# Patient Record
Sex: Male | Born: 1980 | Race: White | Hispanic: Yes | Smoking: Current every day smoker
Health system: Southern US, Community
[De-identification: ages and names within clinical notes are randomized; demographics above are authoritative.]

## PROBLEM LIST (undated history)

## (undated) DIAGNOSIS — R51 Headache: Secondary | ICD-10-CM

## (undated) DIAGNOSIS — K219 Gastro-esophageal reflux disease without esophagitis: Secondary | ICD-10-CM

## (undated) DIAGNOSIS — B20 Human immunodeficiency virus [HIV] disease: Secondary | ICD-10-CM

## (undated) DIAGNOSIS — H9191 Unspecified hearing loss, right ear: Secondary | ICD-10-CM

## (undated) DIAGNOSIS — Z9114 Patient's other noncompliance with medication regimen: Secondary | ICD-10-CM

## (undated) DIAGNOSIS — Z91148 Patient's other noncompliance with medication regimen for other reason: Secondary | ICD-10-CM

## (undated) DIAGNOSIS — R519 Headache, unspecified: Secondary | ICD-10-CM

---
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
## 2013-07-02 ENCOUNTER — Encounter (HOSPITAL_COMMUNITY): Payer: Self-pay | Admitting: Emergency Medicine

## 2013-07-02 ENCOUNTER — Emergency Department (HOSPITAL_COMMUNITY)
Admission: EM | Admit: 2013-07-02 | Discharge: 2013-07-02 | Disposition: A | Discharge status: Home / Self Care | Payer: BC Managed Care – PPO | Attending: Emergency Medicine | Admitting: Emergency Medicine

## 2013-07-02 ENCOUNTER — Emergency Department (HOSPITAL_COMMUNITY): Payer: BC Managed Care – PPO

## 2013-07-02 DIAGNOSIS — K610 Anal abscess: Secondary | ICD-10-CM

## 2013-07-02 DIAGNOSIS — K612 Anorectal abscess: Secondary | ICD-10-CM | POA: Insufficient documentation

## 2013-07-02 DIAGNOSIS — Z87891 Personal history of nicotine dependence: Secondary | ICD-10-CM | POA: Insufficient documentation

## 2013-07-02 LAB — BASIC METABOLIC PANEL
BUN: 8 mg/dL (ref 6–23)
Calcium: 10 mg/dL (ref 8.4–10.5)
Chloride: 100 mEq/L (ref 96–112)
Creatinine, Ser: 0.76 mg/dL (ref 0.50–1.35)
GFR calc Af Amer: >90 mL/min (ref >90)

## 2013-07-02 LAB — CBC WITH DIFFERENTIAL/PLATELET
Basophils Absolute: 0 10*3/uL (ref 0.0–0.1)
Basophils Relative: 0 % (ref 0–1)
Eosinophils Absolute: 0.3 10*3/uL (ref 0.0–0.7)
Eosinophils Relative: 3 % (ref 0–5)
HCT: 38.1 % — ABNORMAL LOW (ref 39.0–52.0)
Lymphocytes Relative: 27 % (ref 12–46)
MCH: 33.2 pg (ref 26.0–34.0)
MCHC: 33.6 g/dL (ref 30.0–36.0)
Monocytes Absolute: 0.9 10*3/uL (ref 0.1–1.0)
Monocytes Relative: 9 % (ref 3–12)
Neutro Abs: 5.6 10*3/uL (ref 1.7–7.7)
RDW: 14.2 % (ref 11.5–15.5)
WBC: 9.2 10*3/uL (ref 4.0–10.5)

## 2013-07-02 MED ORDER — IOHEXOL 300 MG/ML  SOLN
100.0000 mL | Freq: Once | INTRAMUSCULAR | Status: AC | PRN
  Administered 2013-07-02: 100 mL via INTRAVENOUS

## 2013-07-02 MED ORDER — AMPICILLIN-SULBACTAM SODIUM 3 (2-1) G IJ SOLR
3.0000 g | Freq: Once | INTRAMUSCULAR | Status: AC
  Administered 2013-07-02: 3 g via INTRAVENOUS
  Filled 2013-07-02: qty 3

## 2013-07-02 MED ORDER — DOCUSATE SODIUM 100 MG PO CAPS
100.0000 mg | ORAL_CAPSULE | Freq: Two times a day (BID) | ORAL | Status: DC
Start: 2013-07-02 — End: 2014-09-06

## 2013-07-02 MED ORDER — MORPHINE SULFATE 4 MG/ML IJ SOLN
4.0000 mg | Freq: Once | INTRAMUSCULAR | Status: AC
  Administered 2013-07-02: 4 mg via INTRAVENOUS
  Filled 2013-07-02: qty 1

## 2013-07-02 MED ORDER — LIDOCAINE-EPINEPHRINE (PF) 1 %-1:200000 IJ SOLN
10.0000 mL | Freq: Once | INTRAMUSCULAR | Status: AC
  Administered 2013-07-02: 10 mL
  Filled 2013-07-02 (×2): qty 10

## 2013-07-02 MED ORDER — METRONIDAZOLE 500 MG PO TABS: 500.0000 mg | ORAL_TABLET | Freq: Two times a day (BID) | ORAL | Status: DC

## 2013-07-02 MED ORDER — OXYCODONE-ACETAMINOPHEN 5-325 MG PO TABS: 2.0000 | ORAL_TABLET | ORAL | Status: DC | PRN

## 2013-07-02 NOTE — ED Notes (Signed)
Pt tolerated draining of abscess well. Bandage applied.

## 2013-07-02 NOTE — ED Notes (Signed)
Pt reports abscess-like area at perineal area x1 week. Pt denies drainage or bleeding from area. Pt reports pain in area.

## 2013-07-02 NOTE — ED Provider Notes (Signed)
CSN: [id number]     Arrival date & time 07/02/13  1103 History   First MD Initiated Contact with Patient 07/02/13 1309     This chart was scribed for Joya Gaskins, MD by Ellin Mayhew, ED Scribe. This patient was seen in room APA12/APA12 and the patient's care was started at 1:26 PM.    Chief Complaint  Patient presents with  . Abscess   Patient is a 32 y.o. male presenting with abscess. The history is provided by the patient. No language interpreter was used.  Abscess Location:  Ano-genital Ano-genital abscess location:  Perineum Abscess quality: painful   Abscess quality: not draining   Duration:  1 week Progression:  Worsening Associated symptoms: no fever, no nausea and no vomiting     HPI Comments: Mouhamed Glassco is a 32 y.o. male who presents to the Emergency Department complaining of abscess near the perirectal region that he noticed one week ago. Patient experienced some pain with this that was at its worse yesterday. Patient denies fever, chills, vomiting or diarrhea. Patient denies any related illness.    PMH - none History reviewed. No pertinent past surgical history. Family History  Problem Relation Age of Onset  . Cancer Other   . Diabetes Other    History  Substance Use Topics  . Smoking status: Former Smoker -- 2.00 packs/day for 20 years    Types: Cigarettes    Quit date: 06/07/2013  . Smokeless tobacco: Current User    Types: Snuff  . Alcohol Use: Yes     Comment: occasional    Review of Systems  Constitutional: Negative for fever and chills.  Gastrointestinal: Negative for nausea, vomiting, abdominal pain and diarrhea.  Genitourinary: Negative for dysuria.  Skin: Positive for wound.  All other systems reviewed and are negative.    Allergies  Review of patient's allergies indicates no known allergies.  Home Medications  No current outpatient prescriptions on file. Triage Vitals: BP 147/91  Pulse 96  Temp(Src) 98 F (36.7 C) (Oral)  Resp  20  Ht 5\' 9"  (1.753 m)  Wt 205 lb (92.987 kg)  BMI 30.26 kg/m2  SpO2 97% Physical Exam  Nursing note and vitals reviewed. CONSTITUTIONAL: Well developed/well nourished HEAD: Normocephalic/atraumatic EYES: EOMI/PERRL ENMT: Mucous membranes moist NECK: supple no meningeal signs SPINE:entire spine nontender CV: S1/S2 noted, no murmurs/rubs/gallops noted LUNGS: Lungs are clear to auscultation bilaterally, no apparent distress ABDOMEN: soft, nontender, no rebound or guarding GU:no cva tenderness, large perirectal abscess, no crepitance or drainage noted, chaperone present NEURO: Pt is awake/alert, moves all extremitiesx4 EXTREMITIES: pulses normal, full ROM SKIN: warm, color normal PSYCH: no abnormalities of mood noted   ED Course  Procedures (including critical care time)  Medications  morphine 4 MG/ML injection 4 mg (not administered)  Ampicillin-Sulbactam (UNASYN) 3 g in sodium chloride 0.9 % 100 mL IVPB (not administered)    DIAGNOSTIC STUDIES: Oxygen Saturation is 97% on room air, normal by my interpretation.    COORDINATION OF CARE: 1:31 PM-CT Scan of pelvis ordered. Treatment plan discussed and patient agrees 3:07 PM Ct scan shows superficial abscess Plan is to drain in the Ed D/w dr Lovell Sheehan, requests send home with flagyll and f/u on Thursday  INCISION AND DRAINAGE Performed by: Joya Gaskins Consent: Verbal consent obtained. Risks and benefits: risks, benefits and alternatives were discussed Type: abscess  Body area: perianal region  Anesthesia: local infiltration  Incision was made with a scalpel.  Local anesthetic: lidocaine 1% with epinephrine  Anesthetic  total: 5 ml  Complexity: complex Blunt dissection to break up loculations  Drainage: purulent  Drainage amount: significant  Pt refused to have further treatment and refused packing  Patient tolerance: Patient tolerated the procedure well with no immediate complications. chaperone present  for exam    Labs Review Labs Reviewed - No data to display Imaging Review No results found.  EKG Interpretation   None       MDM  No diagnosis found. Nursing notes including past medical history and social history reviewed and considered in documentation Labs/vital reviewed and considered    I personally performed the services described in this documentation, which was scribed in my presence. The recorded information has been reviewed and is accurate.      Joya Gaskins, MD 07/02/13 [phone]

## 2013-07-02 NOTE — ED Notes (Signed)
Patient c/o abscess between scrotum and rectum. Patient denies any drainage, unsure of any fevers. Denies any pain with BM but pain with wiping.

## 2014-06-08 ENCOUNTER — Encounter (HOSPITAL_COMMUNITY): Payer: Self-pay | Admitting: Emergency Medicine

## 2014-06-08 ENCOUNTER — Emergency Department (HOSPITAL_COMMUNITY)
Admission: EM | Admit: 2014-06-08 | Discharge: 2014-06-08 | Disposition: A | Discharge status: Home / Self Care | Payer: BC Managed Care – PPO | Attending: Emergency Medicine | Admitting: Emergency Medicine

## 2014-06-08 ENCOUNTER — Emergency Department (HOSPITAL_COMMUNITY): Payer: BC Managed Care – PPO

## 2014-06-08 DIAGNOSIS — R51 Headache: Secondary | ICD-10-CM | POA: Insufficient documentation

## 2014-06-08 DIAGNOSIS — R519 Headache, unspecified: Secondary | ICD-10-CM

## 2014-06-08 DIAGNOSIS — Z87891 Personal history of nicotine dependence: Secondary | ICD-10-CM | POA: Insufficient documentation

## 2014-06-08 LAB — BASIC METABOLIC PANEL
Anion gap: 12 (ref 5–15)
BUN: 13 mg/dL (ref 6–23)
CO2: 26 mEq/L (ref 19–32)
Calcium: 9.5 mg/dL (ref 8.4–10.5)
Chloride: 97 mEq/L (ref 96–112)
Creatinine, Ser: 0.77 mg/dL (ref 0.50–1.35)
GFR calc Af Amer: >90 mL/min (ref >90)
GFR calc non Af Amer: >90 mL/min (ref >90)
Glucose, Bld: 121 mg/dL — ABNORMAL HIGH (ref 70–99)
Potassium: 3.5 mEq/L — ABNORMAL LOW (ref 3.7–5.3)
Sodium: 135 mEq/L — ABNORMAL LOW (ref 137–147)

## 2014-06-08 MED ORDER — DIPHENHYDRAMINE HCL 50 MG/ML IJ SOLN
25.0000 mg | Freq: Once | INTRAMUSCULAR | Status: AC
  Administered 2014-06-08: 25 mg via INTRAVENOUS
  Filled 2014-06-08: qty 1

## 2014-06-08 MED ORDER — KETOROLAC TROMETHAMINE 30 MG/ML IJ SOLN
INTRAMUSCULAR | Status: AC
  Administered 2014-06-08: 30 mg
  Filled 2014-06-08: qty 1

## 2014-06-08 MED ORDER — PROCHLORPERAZINE EDISYLATE 5 MG/ML IJ SOLN
10.0000 mg | Freq: Four times a day (QID) | INTRAMUSCULAR | Status: DC | PRN
  Administered 2014-06-08: 10 mg via INTRAVENOUS
  Filled 2014-06-08: qty 2

## 2014-06-08 MED ORDER — KETOROLAC TROMETHAMINE 15 MG/ML IJ SOLN
15.0000 mg | Freq: Once | INTRAMUSCULAR | Status: AC
  Filled 2014-06-08: qty 1

## 2014-06-08 MED ORDER — SODIUM CHLORIDE 0.9 % IV BOLUS (SEPSIS)
1000.0000 mL | Freq: Once | INTRAVENOUS | Status: AC
  Administered 2014-06-08: 1000 mL via INTRAVENOUS

## 2014-06-08 NOTE — ED Provider Notes (Signed)
CSN: [id number]     Arrival date & time 06/08/14  1934 History   First MD Initiated Contact with Patient 06/08/14 1943     Chief Complaint  Patient presents with  . Headache     (Consider location/radiation/quality/duration/timing/severity/associated sxs/prior Treatment) HPI   33 year old male with headache. Gradual onset 3 days ago. Pain is the right temporal region with occasional sharper pain behind his right ear. Headache is been constant since onset. No appreciable exacerbating relieving factors. No fevers or chills. No nausea or vomiting. No change in visual acuity or other visual complaints. Denies any smoking history. He does have a history of HIV and reports noncompliance with his medications. No unusual rash. No family history of aneurysm that he is aware of. Neck pain or neck stiffness. No pain in his right ear any drainage. No shortness of breath. Has been taking NSAIDs with minimal relief.  History reviewed. No pertinent past medical history. History reviewed. No pertinent past surgical history. Family History  Problem Relation Age of Onset  . Cancer Other   . Diabetes Other    History  Substance Use Topics  . Smoking status: Former Smoker -- 2.00 packs/day for 20 years    Types: Cigarettes    Quit date: 06/07/2013  . Smokeless tobacco: Current User    Types: Snuff  . Alcohol Use: Yes     Comment: occasional    Review of Systems  All systems reviewed and negative, other than as noted in HPI.   Allergies  Review of patient's allergies indicates no known allergies.  Home Medications   Prior to Admission medications   Medication Sig Start Date End Date Taking? Authorizing Provider  AFLURIA PRESERVATIVE FREE 0.5 ML SUSY once.  04/25/14  Yes Historical Provider, MD  phenylephrine (SUDAFED PE) 10 MG TABS tablet Take 10-20 mg by mouth every 4 (four) hours as needed (for congestion).   Yes Historical Provider, MD  docusate sodium (COLACE) 100 MG capsule Take 1  capsule (100 mg total) by mouth every 12 (twelve) hours. Patient not taking: Reported on 06/08/2014 07/02/13   Joya Gaskinsonald W Wickline, MD  metroNIDAZOLE (FLAGYL) 500 MG tablet Take 1 tablet (500 mg total) by mouth 2 (two) times daily. One po bid x 7 days Patient not taking: Reported on 06/08/2014 07/02/13   Joya Gaskinsonald W Wickline, MD  oxyCODONE-acetaminophen (PERCOCET/ROXICET) 5-325 MG per tablet Take 2 tablets by mouth every 4 (four) hours as needed for severe pain. Patient not taking: Reported on 06/08/2014 07/02/13   Joya Gaskinsonald W Wickline, MD   BP 138/90 mmHg  Pulse 87  Temp(Src) 98.2 F (36.8 C) (Oral)  Resp 20  Ht 5\' 9"  (1.753 m)  Wt 215 lb (97.523 kg)  BMI 31.74 kg/m2  SpO2 96% Physical Exam  Constitutional: He is oriented to person, place, and time. He appears well-developed and well-nourished. No distress.  HENT:  Head: Normocephalic and atraumatic.  Right Ear: External ear normal.  Left Ear: External ear normal.  No mastoid tenderness or concerning skin/scalp lesions noted.  Eyes: Conjunctivae and EOM are normal. Pupils are equal, round, and reactive to light. Right eye exhibits no discharge. Left eye exhibits no discharge.  Neck: Neck supple.  No nuchal rigidity. Negative Kernig's and Brudzinski signs.  Cardiovascular: Normal rate, regular rhythm and normal heart sounds.  Exam reveals no gallop and no friction rub.   No murmur heard. Pulmonary/Chest: Effort normal and breath sounds normal. No respiratory distress.  Abdominal: Soft. He exhibits no distension. There is no  tenderness.  Musculoskeletal: He exhibits no edema or tenderness.  Neurological: He is alert and oriented to person, place, and time. No cranial nerve deficit. He exhibits normal muscle tone. Coordination normal.  Speech clear. Content appropriate. Follows commands. Good finger to nose testing bilaterally.  Skin: Skin is warm and dry. He is not diaphoretic.  Psychiatric: He has a normal mood and affect. His behavior is  normal. Thought content normal.  Nursing note and vitals reviewed.   ED Course  Procedures (including critical care time) Labs Review Labs Reviewed  BASIC METABOLIC PANEL - Abnormal; Notable for the following:    Sodium 135 (*)    Potassium 3.5 (*)    Glucose, Bld 121 (*)    All other components within normal limits    Imaging Review No results found.   EKG Interpretation None      MDM   Final diagnoses:  Headache    33 year old male with right-sided headache. No trauma. Afebrile. No nuchal rigidity. Denies any trauma. No acute visual complaints. Eye exam is unremarkable. Patient does not have a significant headache history and is noncompliant with his HIV medications, otherwise no other particularly concerning features. Doubt bleed. No meningismus on exam. Higher consideration for possible infectious etiologies particularly atypical with his history, but clinically doubt. Doubt glaucoma or other ocular etiology. No contacts with similar symptoms to suggest carbon monoxide poisoning. No use of blood thinners or known prothrombotic state. Patient is very anxious about his headache and the fact that he's been noncompliant with his HIV medications. Ultimately agreed to CAT scan his head which I do not think is completely unreasonable. We'll obtain some labs as well. Symptomatic treatment. This workup reveals significant abnormality, anticipate discharge assuming can obtain adequate symptom control.    Raeford RazorStephen Gracelyn Coventry, MD 06/13/14 1136

## 2014-06-08 NOTE — Discharge Instructions (Signed)

## 2014-06-08 NOTE — ED Notes (Signed)
Complaining of headache x 3days on right side,sharp pain behind right ear at time, dizziness, usually did not has headaches at all.

## 2014-08-29 ENCOUNTER — Encounter (HOSPITAL_COMMUNITY): Payer: Self-pay | Admitting: Emergency Medicine

## 2014-08-29 ENCOUNTER — Emergency Department (HOSPITAL_COMMUNITY)
Admission: EM | Admit: 2014-08-29 | Discharge: 2014-08-30 | Disposition: A | Discharge status: Home / Self Care | Payer: BLUE CROSS/BLUE SHIELD | Attending: Emergency Medicine | Admitting: Emergency Medicine

## 2014-08-29 DIAGNOSIS — R51 Headache: Secondary | ICD-10-CM | POA: Diagnosis present

## 2014-08-29 DIAGNOSIS — Z79899 Other long term (current) drug therapy: Secondary | ICD-10-CM | POA: Diagnosis not present

## 2014-08-29 DIAGNOSIS — Z9114 Patient's other noncompliance with medication regimen: Secondary | ICD-10-CM | POA: Diagnosis not present

## 2014-08-29 DIAGNOSIS — G43019 Migraine without aura, intractable, without status migrainosus: Secondary | ICD-10-CM | POA: Insufficient documentation

## 2014-08-29 DIAGNOSIS — Z21 Asymptomatic human immunodeficiency virus [HIV] infection status: Secondary | ICD-10-CM | POA: Diagnosis not present

## 2014-08-29 DIAGNOSIS — G43919 Migraine, unspecified, intractable, without status migrainosus: Secondary | ICD-10-CM

## 2014-08-29 DIAGNOSIS — Z720 Tobacco use: Secondary | ICD-10-CM | POA: Diagnosis not present

## 2014-08-29 HISTORY — DX: Patient's other noncompliance with medication regimen for other reason: Z91.148

## 2014-08-29 HISTORY — DX: Patient's other noncompliance with medication regimen: Z91.14

## 2014-08-29 HISTORY — DX: Human immunodeficiency virus (HIV) disease: B20

## 2014-08-29 HISTORY — DX: Headache: R51

## 2014-08-29 HISTORY — DX: Headache, unspecified: R51.9

## 2014-08-29 NOTE — ED Notes (Signed)
Headache for 13 days, No HI, alert, nausea /vomiting intermittently.

## 2014-08-29 NOTE — ED Provider Notes (Addendum)
CSN: [id number]     Arrival date & time 08/29/14  2016 History  This chart was scribed for Janice Norrie, MD by Edison Simon, ED Scribe. This patient was seen in room APA06/APA06 and the patient's care was started at 12:06 AM.    Chief Complaint  Patient presents with  . Headache   The history is provided by the patient. No language interpreter was used.    HPI Comments: Darrell Ortiz is a 34 y.o. male who presents to the Emergency Department complaining of headache with sudden onset 13 days ago. He states it affects his right eye always and states it is most intense in his lower occipital area. He describes it as pressure and pulsating. He states he is able to sleep, though he sometimes needs Goodys to help. He states pain is worse with coughing, sneezing, and standing up quickly. He denies other difference in pain level due to positional changes. He reports associated nausea and 1 count of vomiting 9 days ago. He notes that his voice sounds odd and suspects he may have fluid in his ears. He reports similar headache 4 months ago and was diagnosed with migraine here. He states he has been using Ibuprofen, Tylenol, and Goodys with some improvement, especially Ibuprofen and even more so with Goodys. He states he used 3-4 doses of Goodys 8 days ago through 4 days ago, then headache almost resolved; it then returned 2 days ago and states he used  6 Goodys that day and 8-9 yesterday. He reports waking with diaphoresis and chills at night since using Goodys. He states he has natural gas heating in his house. He states nobody at his home has similar headaches. He states he smokes 1ppd down from 2 ppd. He states he rarely uses alcohol. 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 He is going to have his vision checked.  He states pain seems to be worse at work, but thinks it may be because he sleeps at home. He has history of HIV dx in 2012 but states it is not followed  and he does not take medications for it. He states he does not get sick easily and denies diarrhea, cough, weight loss. He states he feels healthy. He denies tinnitus, numbness/tingling, fever, photophobia, or phonophobia.  No PCP, per patient  Past Medical History  Diagnosis Date  . HIV disease   . Headache   . Noncompliance with medication regimen    History reviewed. No pertinent past surgical history. Family History  Problem Relation Age of Onset  . Cancer Other   . Diabetes Other    History  Substance Use Topics  . Smoking status: Current Some Day Smoker -- 2.00 packs/day for 20 years    Types: Cigarettes    Last Attempt to Quit: 06/07/2013  . Smokeless tobacco: Current User    Types: Snuff  . Alcohol Use: Yes     Comment: occasional  employed Smokes 1 ppd  Review of Systems  Constitutional: Positive for chills and diaphoresis. Negative for fever.  HENT: Negative for tinnitus.   Eyes: Negative for photophobia.  Gastrointestinal: Positive for nausea and vomiting. Negative for diarrhea.  Neurological: Positive for headaches. Negative for numbness.  All other systems reviewed and are negative.     Allergies  Review of patient's allergies indicates no known allergies.  Home Medications   Prior to Admission medications   Medication Sig Start Date End  Date Taking? Authorizing Provider  AFLURIA PRESERVATIVE FREE 0.5 ML SUSY once.  04/25/14   Historical Provider, MD  docusate sodium (COLACE) 100 MG capsule Take 1 capsule (100 mg total) by mouth every 12 (twelve) hours. Patient not taking: Reported on 06/08/2014 07/02/13   Joya Gaskins, MD  metroNIDAZOLE (FLAGYL) 500 MG tablet Take 1 tablet (500 mg total) by mouth 2 (two) times daily. One po bid x 7 days Patient not taking: Reported on 06/08/2014 07/02/13   Joya Gaskins, MD  oxyCODONE-acetaminophen (PERCOCET/ROXICET) 5-325 MG per tablet Take 2 tablets by mouth every 4 (four) hours as needed for severe  pain. Patient not taking: Reported on 06/08/2014 07/02/13   Joya Gaskins, MD  phenylephrine (SUDAFED PE) 10 MG TABS tablet Take 10-20 mg by mouth every 4 (four) hours as needed (for congestion).    Historical Provider, MD   BP 134/86 mmHg  Pulse 97  Temp(Src) 98.7 F (37.1 C) (Oral)  Resp 18  Ht 5\' 9"  (1.753 m)  Wt 199 lb (90.266 kg)  BMI 29.37 kg/m2  SpO2 97%  Vital signs normal   Physical Exam  Constitutional: He is oriented to person, place, and time. He appears well-developed and well-nourished.  Non-toxic appearance. He does not appear ill. He appears distressed.  Appears uncomfortable  HENT:  Head: Normocephalic and atraumatic.  Right Ear: Hearing, tympanic membrane, external ear and ear canal normal.  Left Ear: Hearing, tympanic membrane, external ear and ear canal normal.  Nose: Nose normal. No mucosal edema or rhinorrhea.  Mouth/Throat: Oropharynx is clear and moist and mucous membranes are normal. No dental abscesses or uvula swelling.  Eyes: Conjunctivae and EOM are normal. Pupils are equal, round, and reactive to light.  Neck: Normal range of motion and full passive range of motion without pain. Neck supple.  Cardiovascular: Normal rate, regular rhythm and normal heart sounds.  Exam reveals no gallop and no friction rub.   No murmur heard. Pulmonary/Chest: Effort normal and breath sounds normal. No respiratory distress. He has no wheezes. He has no rhonchi. He has no rales. He exhibits no tenderness and no crepitus.  Abdominal: Soft. Normal appearance and bowel sounds are normal. He exhibits no distension. There is no tenderness. There is no rebound and no guarding.  Musculoskeletal: Normal range of motion. He exhibits no edema or tenderness.  Moves all extremities well.   Neurological: He is alert and oriented to person, place, and time. He has normal strength. No cranial nerve deficit.  Skin: Skin is warm, dry and intact. No rash noted. No erythema. No pallor.   Psychiatric: He has a normal mood and affect. His speech is normal and behavior is normal. His mood appears not anxious.  Nursing note and vitals reviewed.   ED Course  Procedures (including critical care time)  Medications  sodium chloride 0.9 % bolus 1,000 mL (1,000 mLs Intravenous New Bag/Given 08/30/14 0028)  metoCLOPramide (REGLAN) injection 10 mg (10 mg Intravenous Given 08/30/14 0031)  diphenhydrAMINE (BENADRYL) injection 25 mg (25 mg Intravenous Given 08/30/14 0029)  dexamethasone (DECADRON) injection 10 mg (10 mg Intravenous Given 08/30/14 0032)     DIAGNOSTIC STUDIES: Oxygen Saturation is 99% on room air, normal by my interpretation.    COORDINATION OF CARE: 12:20 AM Discussed treatment plan with patient at beside, including ASA level check and migraine cocktail. The patient agrees with the plan and has no further questions at this time. Patient had CT scan of his head done with his last  ED visit for headache in November. The CT scan was reviewed by me. Patient also had a normal CBC and be met done in November with his headache visit.  Recheck at discharge. Patient states his headache is gone.   Labs Review Results for orders placed or performed during the hospital encounter of 93/71/69  Salicylate level  Result Value Ref Range   Salicylate Lvl 67.8 2.8 - 20.0 mg/dL   Laboratory interpretation all normal with nontoxic aspirin level    Imaging Review No results found.   EKG Interpretation None      MDM   Final diagnoses:  Intractable migraine, unspecified migraine type   Plan discharge  Rolland Porter, MD, FACEP   I personally performed the services described in this documentation, which was scribed in my presence. The recorded information has been reviewed and considered.  Rolland Porter, MD, FACEP   Janice Norrie, MD 08/30/14 Denyse Dago  Janice Norrie, MD 08/30/14 [phone]

## 2014-08-30 LAB — SALICYLATE LEVEL: SALICYLATE LVL: 12.3 mg/dL (ref 2.8–20.0)

## 2014-08-30 MED ORDER — METOCLOPRAMIDE HCL 5 MG/ML IJ SOLN
10.0000 mg | Freq: Once | INTRAMUSCULAR | Status: AC
  Administered 2014-08-30: 10 mg via INTRAVENOUS
  Filled 2014-08-30: qty 2

## 2014-08-30 MED ORDER — SODIUM CHLORIDE 0.9 % IV BOLUS (SEPSIS)
1000.0000 mL | Freq: Once | INTRAVENOUS | Status: AC
  Administered 2014-08-30: 1000 mL via INTRAVENOUS

## 2014-08-30 MED ORDER — DIPHENHYDRAMINE HCL 50 MG/ML IJ SOLN
25.0000 mg | Freq: Once | INTRAMUSCULAR | Status: AC
  Administered 2014-08-30: 25 mg via INTRAVENOUS
  Filled 2014-08-30: qty 1

## 2014-08-30 MED ORDER — DEXAMETHASONE SODIUM PHOSPHATE 4 MG/ML IJ SOLN
10.0000 mg | Freq: Once | INTRAMUSCULAR | Status: AC
  Administered 2014-08-30: 10 mg via INTRAVENOUS
  Filled 2014-08-30: qty 3

## 2014-08-30 NOTE — Discharge Instructions (Signed)
Go home and rest. Drink plenty of fluids. Recheck if you feel worse again.    Recurrent Migraine Headache A migraine headache is an intense, throbbing pain on one or both sides of your head. Recurrent migraines keep coming back. A migraine can last for 30 minutes to several hours. CAUSES  The exact cause of a migraine headache is not always known. However, a migraine may be caused when nerves in the brain become irritated and release chemicals that cause inflammation. This causes pain. Certain things may also trigger migraines, such as:   Alcohol.  Smoking.  Stress.  Menstruation.  Aged cheeses.  Foods or drinks that contain nitrates, glutamate, aspartame, or tyramine.  Lack of sleep.  Chocolate.  Caffeine.  Hunger.  Physical exertion.  Fatigue.  Medicines used to treat chest pain (nitroglycerine), birth control pills, estrogen, and some blood pressure medicines. SYMPTOMS   Pain on one or both sides of your head.  Pulsating or throbbing pain.  Severe pain that prevents daily activities.  Pain that is aggravated by any physical activity.  Nausea, vomiting, or both.  Dizziness.  Pain with exposure to bright lights, loud noises, or activity.  General sensitivity to bright lights, loud noises, or smells. Before you get a migraine, you may get warning signs that a migraine is coming (aura). An aura may include:  Seeing flashing lights.  Seeing bright spots, halos, or zigzag lines.  Having tunnel vision or blurred vision.  Having feelings of numbness or tingling.  Having trouble talking.  Having muscle weakness. DIAGNOSIS  A recurrent migraine headache is often diagnosed based on:  Symptoms.  Physical examination.  A CT scan or MRI of your head. These imaging tests cannot diagnose migraines but can help rule out other causes of headaches.  TREATMENT  Medicines may be given for pain and nausea. Medicines can also be given to help prevent recurrent  migraines. HOME CARE INSTRUCTIONS  Only take over-the-counter or prescription medicines for pain or discomfort as directed by your health care provider. The use of long-term narcotics is not recommended.  Lie down in a dark, quiet room when you have a migraine.  Keep a journal to find out what may trigger your migraine headaches. For example, write down:  What you eat and drink.  How much sleep you get.  Any change to your diet or medicines.  Limit alcohol consumption.  Quit smoking if you smoke.  Get 7-9 hours of sleep, or as recommended by your health care provider.  Limit stress.  Keep lights dim if bright lights bother you and make your migraines worse. SEEK MEDICAL CARE IF:   You do not get relief from the medicines given to you.  You have a recurrence of pain.  You have a fever. SEEK IMMEDIATE MEDICAL CARE IF:  Your migraine becomes severe.  You have a stiff neck.  You have loss of vision.  You have muscular weakness or loss of muscle control.  You start losing your balance or have trouble walking.  You feel faint or pass out.  You have severe symptoms that are different from your first symptoms. MAKE SURE YOU:   Understand these instructions.  Will watch your condition.  Will get help right away if you are not doing well or get worse. Document Released: 04/06/2001 Document Revised: 11/26/2013 Document Reviewed: 03/19/2013 [facility] [place] [facility] Patient Information 2015 [place], [place]. This information is not intended to replace advice given to you by your health care provider. Make sure you discuss any  questions you have with your health care provider. ° °

## 2014-08-30 NOTE — ED Notes (Signed)
Pt resting calmly w/ eyes closed. Rise & fall of the chest noted. Family at bedside. Bed in low position, side rails up x2. NAD noted at this time.  

## 2014-09-06 ENCOUNTER — Inpatient Hospital Stay (HOSPITAL_COMMUNITY)
Admission: EM | Admit: 2014-09-06 | Discharge: 2014-09-11 | DRG: 070 | Disposition: A | Discharge status: Home / Self Care | Payer: BLUE CROSS/BLUE SHIELD | Attending: Internal Medicine | Admitting: Internal Medicine

## 2014-09-06 ENCOUNTER — Emergency Department (HOSPITAL_COMMUNITY): Payer: BLUE CROSS/BLUE SHIELD

## 2014-09-06 ENCOUNTER — Inpatient Hospital Stay (HOSPITAL_COMMUNITY): Payer: BLUE CROSS/BLUE SHIELD

## 2014-09-06 ENCOUNTER — Encounter (HOSPITAL_COMMUNITY): Payer: Self-pay

## 2014-09-06 DIAGNOSIS — K5900 Constipation, unspecified: Secondary | ICD-10-CM | POA: Diagnosis present

## 2014-09-06 DIAGNOSIS — K5909 Other constipation: Secondary | ICD-10-CM | POA: Diagnosis not present

## 2014-09-06 DIAGNOSIS — R51 Headache: Secondary | ICD-10-CM

## 2014-09-06 DIAGNOSIS — Z791 Long term (current) use of non-steroidal anti-inflammatories (NSAID): Secondary | ICD-10-CM | POA: Diagnosis not present

## 2014-09-06 DIAGNOSIS — A523 Neurosyphilis, unspecified: Secondary | ICD-10-CM | POA: Diagnosis present

## 2014-09-06 DIAGNOSIS — R519 Headache, unspecified: Secondary | ICD-10-CM | POA: Diagnosis present

## 2014-09-06 DIAGNOSIS — Z79899 Other long term (current) drug therapy: Secondary | ICD-10-CM | POA: Diagnosis not present

## 2014-09-06 DIAGNOSIS — G939 Disorder of brain, unspecified: Secondary | ICD-10-CM | POA: Diagnosis not present

## 2014-09-06 DIAGNOSIS — H9191 Unspecified hearing loss, right ear: Secondary | ICD-10-CM | POA: Diagnosis present

## 2014-09-06 DIAGNOSIS — Z9114 Patient's other noncompliance with medication regimen: Secondary | ICD-10-CM | POA: Diagnosis present

## 2014-09-06 DIAGNOSIS — F17210 Nicotine dependence, cigarettes, uncomplicated: Secondary | ICD-10-CM | POA: Diagnosis present

## 2014-09-06 DIAGNOSIS — B20 Human immunodeficiency virus [HIV] disease: Secondary | ICD-10-CM | POA: Diagnosis present

## 2014-09-06 DIAGNOSIS — K219 Gastro-esophageal reflux disease without esophagitis: Secondary | ICD-10-CM | POA: Diagnosis present

## 2014-09-06 DIAGNOSIS — G9389 Other specified disorders of brain: Principal | ICD-10-CM | POA: Diagnosis present

## 2014-09-06 DIAGNOSIS — Z9119 Patient's noncompliance with other medical treatment and regimen: Secondary | ICD-10-CM | POA: Diagnosis present

## 2014-09-06 DIAGNOSIS — A872 Lymphocytic choriomeningitis: Secondary | ICD-10-CM | POA: Diagnosis present

## 2014-09-06 DIAGNOSIS — Z91199 Patient's noncompliance with other medical treatment and regimen due to unspecified reason: Secondary | ICD-10-CM | POA: Diagnosis present

## 2014-09-06 HISTORY — DX: Unspecified hearing loss, right ear: H91.91

## 2014-09-06 HISTORY — DX: Gastro-esophageal reflux disease without esophagitis: K21.9

## 2014-09-06 LAB — CBC WITH DIFFERENTIAL/PLATELET
BASOS ABS: 0 10*3/uL (ref 0.0–0.1)
BASOS PCT: 0 % (ref 0–1)
EOS ABS: 0.1 10*3/uL (ref 0.0–0.7)
Eosinophils Relative: 2 % (ref 0–5)
HCT: 35.7 % — ABNORMAL LOW (ref 39.0–52.0)
Hemoglobin: 12 g/dL — ABNORMAL LOW (ref 13.0–17.0)
Lymphocytes Relative: 31 % (ref 12–46)
Lymphs Abs: 1 10*3/uL (ref 0.7–4.0)
MCH: 33.1 pg (ref 26.0–34.0)
MCHC: 33.6 g/dL (ref 30.0–36.0)
MCV: 98.6 fL (ref 78.0–100.0)
Monocytes Absolute: 0.3 10*3/uL (ref 0.1–1.0)
Monocytes Relative: 11 % (ref 3–12)
NEUTROS ABS: 1.8 10*3/uL (ref 1.7–7.7)
Neutrophils Relative %: 57 % (ref 43–77)
PLATELETS: 116 10*3/uL — AB (ref 150–400)
RBC: 3.62 MIL/uL — ABNORMAL LOW (ref 4.22–5.81)
RDW: 12.1 % (ref 11.5–15.5)
WBC: 3.2 10*3/uL — ABNORMAL LOW (ref 4.0–10.5)

## 2014-09-06 LAB — COMPREHENSIVE METABOLIC PANEL
ALT: 39 U/L (ref 0–53)
AST: 20 U/L (ref 0–37)
Albumin: 3.2 g/dL — ABNORMAL LOW (ref 3.5–5.2)
Alkaline Phosphatase: 57 U/L (ref 39–117)
BILIRUBIN TOTAL: 0.4 mg/dL (ref 0.3–1.2)
BUN: 9 mg/dL (ref 6–23)
CO2: 25 mmol/L (ref 19–32)
CREATININE: 0.64 mg/dL (ref 0.50–1.35)
Calcium: 8.2 mg/dL — ABNORMAL LOW (ref 8.4–10.5)
Chloride: 109 mmol/L (ref 96–112)
Glucose, Bld: 98 mg/dL (ref 70–99)
Potassium: 3.9 mmol/L (ref 3.5–5.1)
Sodium: 135 mmol/L (ref 135–145)
Total Protein: 6.5 g/dL (ref 6.0–8.3)

## 2014-09-06 LAB — CBC
HCT: 41.1 % (ref 39.0–52.0)
Hemoglobin: 14.1 g/dL (ref 13.0–17.0)
MCH: 33.6 pg (ref 26.0–34.0)
MCHC: 34.3 g/dL (ref 30.0–36.0)
MCV: 97.9 fL (ref 78.0–100.0)
Platelets: 146 10*3/uL — ABNORMAL LOW (ref 150–400)
RBC: 4.2 MIL/uL — AB (ref 4.22–5.81)
RDW: 12.2 % (ref 11.5–15.5)
WBC: 2.7 10*3/uL — ABNORMAL LOW (ref 4.0–10.5)

## 2014-09-06 LAB — T-HELPER CELLS (CD4) COUNT (NOT AT ARMC)
CD4 % Helper T Cell: 16 % — ABNORMAL LOW (ref 33–55)
CD4 T Cell Abs: 170 /uL — ABNORMAL LOW (ref 400–2700)

## 2014-09-06 MED ORDER — METOCLOPRAMIDE HCL 5 MG/ML IJ SOLN
10.0000 mg | Freq: Once | INTRAMUSCULAR | Status: AC
  Administered 2014-09-06: 10 mg via INTRAVENOUS
  Filled 2014-09-06: qty 2

## 2014-09-06 MED ORDER — HYDROMORPHONE HCL 1 MG/ML IJ SOLN
2.0000 mg | INTRAMUSCULAR | Status: DC | PRN
  Administered 2014-09-07: 1 mg via INTRAVENOUS
  Filled 2014-09-06: qty 2

## 2014-09-06 MED ORDER — LIDOCAINE HCL (PF) 1 % IJ SOLN
15.0000 mL | Freq: Once | INTRAMUSCULAR | Status: AC
  Administered 2014-09-06: 15 mL
  Filled 2014-09-06 (×2): qty 15

## 2014-09-06 MED ORDER — MORPHINE SULFATE 4 MG/ML IJ SOLN
4.0000 mg | Freq: Once | INTRAMUSCULAR | Status: AC | PRN
  Administered 2014-09-06: 4 mg via INTRAVENOUS
  Filled 2014-09-06: qty 1

## 2014-09-06 MED ORDER — KETOROLAC TROMETHAMINE 30 MG/ML IJ SOLN
30.0000 mg | Freq: Once | INTRAMUSCULAR | Status: AC
  Administered 2014-09-06: 30 mg via INTRAVENOUS
  Filled 2014-09-06: qty 1

## 2014-09-06 MED ORDER — DEXAMETHASONE SODIUM PHOSPHATE 10 MG/ML IJ SOLN
10.0000 mg | Freq: Once | INTRAMUSCULAR | Status: AC
  Administered 2014-09-06: 10 mg via INTRAVENOUS
  Filled 2014-09-06: qty 1

## 2014-09-06 MED ORDER — SODIUM CHLORIDE 0.9 % IV SOLN
INTRAVENOUS | Status: DC
  Administered 2014-09-06 – 2014-09-08 (×4): via INTRAVENOUS

## 2014-09-06 MED ORDER — LIDOCAINE HCL (PF) 1 % IJ SOLN
15.0000 mL | Freq: Once | INTRAMUSCULAR | Status: AC
  Administered 2014-09-06: 5 mL
  Filled 2014-09-06: qty 15

## 2014-09-06 MED ORDER — SODIUM CHLORIDE 0.9 % IV BOLUS (SEPSIS)
1000.0000 mL | Freq: Once | INTRAVENOUS | Status: AC
  Administered 2014-09-06: 1000 mL via INTRAVENOUS

## 2014-09-06 MED ORDER — DIPHENHYDRAMINE HCL 50 MG/ML IJ SOLN
25.0000 mg | Freq: Once | INTRAMUSCULAR | Status: AC
  Administered 2014-09-06: 25 mg via INTRAVENOUS
  Filled 2014-09-06: qty 1

## 2014-09-06 MED ORDER — NICOTINE 21 MG/24HR TD PT24
21.0000 mg | MEDICATED_PATCH | Freq: Once | TRANSDERMAL | Status: AC
  Administered 2014-09-06: 21 mg via TRANSDERMAL
  Filled 2014-09-06: qty 1

## 2014-09-06 MED ORDER — LORAZEPAM 2 MG/ML IJ SOLN
1.0000 mg | Freq: Once | INTRAMUSCULAR | Status: AC | PRN
  Administered 2014-09-06: 1 mg via INTRAVENOUS
  Filled 2014-09-06: qty 1

## 2014-09-06 MED ORDER — LIDOCAINE HCL 1 % IJ SOLN: 15.0000 mL | Freq: Once | INTRAMUSCULAR | Status: DC

## 2014-09-06 MED ORDER — ENOXAPARIN SODIUM 40 MG/0.4ML ~~LOC~~ SOLN
40.0000 mg | SUBCUTANEOUS | Status: DC
  Administered 2014-09-06: 40 mg via SUBCUTANEOUS
  Filled 2014-09-06: qty 0.4

## 2014-09-06 MED ORDER — ONDANSETRON HCL 4 MG PO TABS: 4.0000 mg | ORAL_TABLET | Freq: Four times a day (QID) | ORAL | Status: DC | PRN

## 2014-09-06 MED ORDER — GADOBENATE DIMEGLUMINE 529 MG/ML IV SOLN
20.0000 mL | Freq: Once | INTRAVENOUS | Status: AC | PRN
  Administered 2014-09-06: 19 mL via INTRAVENOUS

## 2014-09-06 MED ORDER — ONDANSETRON HCL 4 MG/2ML IJ SOLN
4.0000 mg | Freq: Four times a day (QID) | INTRAMUSCULAR | Status: DC | PRN
  Administered 2014-09-07 – 2014-09-08 (×3): 4 mg via INTRAVENOUS
  Filled 2014-09-06 (×3): qty 2

## 2014-09-06 NOTE — Procedures (Signed)
Indication: evaluate CNS mass  Permit: infromed consent obtained and present in the chart and time out performed  Description: pt laying in fetal position prepped and draped in usual sterile manner. 1% lidocaine used to infiltrate subcutaneous and deep tissues with copious amounts. L4-L5 and L3-L4 areas identified. A total of ELEVEN attempts inserting and withdrawing the needle made every time encountering bone by feel of needle.  Procedure stopped after ELEVENTH attempt and IR called for procedure  EBL: mimimal  Complications: none

## 2014-09-06 NOTE — ED Notes (Signed)
Pt seen here last Thursday for head and neck pain, states after meds given here he had improvement, but in the past few days the pain has worsened again

## 2014-09-06 NOTE — Progress Notes (Signed)
Patient admitted after midnight. Please see H&P.  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  It is difficult determine if this is intra-axial or extra-axial however it may have an element of both. Differential diagnosis includes neoplasm such as lymphoma and Kaposi sarcoma. Metastatic disease and glioma considered less likely. Infection also considered. This is not a typical appearance for toxoplasmosis. Other fungal and tuberculosis infections are considerations. Continue Dilaudid 2 mg IV every 4 hours when necessary - may need LP  HIV Patient has not been taking the antiretroviral therapy for past 4 years. CD4 count ordered. Further recommendations as per infectious disease  Darrell CanaryJessica Govanni Plemons DO

## 2014-09-06 NOTE — Consult Note (Signed)
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 Eliseo Squires   HPI: Darrell Ortiz is an 34 y.o. 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 Had taken maybe 4 bottles of STRIBILD since dx in 2012. Multiple reasons given why not started including worried employer would find out. He has had worsening headaches over the past 3 weeks then with transient numbness in right hand. He was seen at [facility] where CT abnormal and MRI shows:  10 x 12 mm peripheral enhancing lesion in the right parietal lobe. It is difficult determine if this is intra-axial or extra-axial however it may have an element of both     Past Medical History  Diagnosis Date  . HIV disease   . Headache   . Noncompliance with medication regimen     History reviewed. No pertinent past surgical history.ergies:   No Known Allergies   Medications: I have reviewed patients current medications as documented in Epic Anti-infectives    None      Social History:  reports that he has been smoking Cigarettes.  He has a 40 pack-year smoking history. His smokeless tobacco use includes Snuff. He reports that he drinks alcohol. He reports that he uses illicit drugs (Marijuana).  Family History  Problem Relation Age of Onset  . Cancer Other   . Diabetes Other     As in HPI and primary teams notes otherwise 12 point review of systems is negative  Blood pressure 139/93, pulse 74, temperature 98.4 F (36.9 C), temperature source Oral, resp. rate 18, height _0  (1.753 m), weight 200 lb (90.719 kg), SpO2 96 %. General: Alert and awake, oriented x3, not in any acute distress. HEENT: anicteric sclera, pupils reactive to light and accommodation, EOMI, oropharynx clear and without exudate CVS regular rate, normal r,  no murmur rubs or  gallops Chest: clear to auscultation bilaterally, no wheezing, rales or rhonchi Abdomen: soft nontender, nondistended, normal bowel sounds, Extremities: no  clubbing or edema noted bilaterally Skin: no rashes Neuro: nonfocal, strength and sensation intact   Results for orders placed or performed during the hospital encounter of 09/06/14 (from the past 48 hour(s))  CBC with Differential/Platelet     Status: Abnormal   Collection Time: 09/06/14  5:00 AM  Result Value Ref Range   WBC 3.2 (L) 4.0 - 10.5 K/uL   RBC 3.62 (L) 4.22 - 5.81 MIL/uL   Hemoglobin 12.0 (L) 13.0 - 17.0 g/dL   HCT 35.7 (L) 39.0 - 52.0 %   MCV 98.6 78.0 - 100.0 fL   MCH 33.1 26.0 - 34.0 pg   MCHC 33.6 30.0 - 36.0 g/dL   RDW 12.1 11.5 - 15.5 %   Platelets 116 (L) 150 - 400 K/uL    Comment: SPECIMEN CHECKED FOR CLOTS PLATELET COUNT CONFIRMED BY SMEAR    Neutrophils Relative % 57 43 - 77 %   Neutro Abs 1.8 1.7 - 7.7 K/uL   Lymphocytes Relative 31 12 - 46 %   Lymphs Abs 1.0 0.7 - 4.0 K/uL   Monocytes Relative 11 3 - 12 %   Monocytes Absolute 0.3 0.1 - 1.0 K/uL   Eosinophils Relative 2 0 - 5 %   Eosinophils Absolute 0.1 0.0 - 0.7 K/uL   Basophils Relative 0 0 -  1 %   Basophils Absolute 0.0 0.0 - 0.1 K/uL  Comprehensive metabolic panel     Status: Abnormal   Collection Time: 09/06/14  5:00 AM  Result Value Ref Range   Sodium 135 135 - 145 mmol/L   Potassium 3.9 3.5 - 5.1 mmol/L   Chloride 109 96 - 112 mmol/L   CO2 25 19 - 32 mmol/L   Glucose, Bld 98 70 - 99 mg/dL   BUN 9 6 - 23 mg/dL   Creatinine, Ser 0.64 0.50 - 1.35 mg/dL   Calcium 8.2 (L) 8.4 - 10.5 mg/dL   Total Protein 6.5 6.0 - 8.3 g/dL   Albumin 3.2 (L) 3.5 - 5.2 g/dL   AST 20 0 - 37 U/L   ALT 39 0 - 53 U/L   Alkaline Phosphatase 57 39 - 117 U/L   Total Bilirubin 0.4 0.3 - 1.2 mg/dL   GFR calc non Af Amer >90 >90 mL/min   GFR calc Af Amer >90 >90 mL/min    Comment: (NOTE) The eGFR has been calculated using the CKD EPI equation. This calculation  has not been validated in all clinical situations. eGFR's persistently <90 mL/min signify possible Chronic Kidney Disease.    Anion gap NOT CALCULATED 5 - 15  CBC     Status: Abnormal   Collection Time: 09/06/14  8:50 AM  Result Value Ref Range   WBC 2.7 (L) 4.0 - 10.5 K/uL   RBC 4.20 (L) 4.22 - 5.81 MIL/uL   Hemoglobin 14.1 13.0 - 17.0 g/dL   HCT 41.1 39.0 - 52.0 %   MCV 97.9 78.0 - 100.0 fL   MCH 33.6 26.0 - 34.0 pg   MCHC 34.3 30.0 - 36.0 g/dL   RDW 12.2 11.5 - 15.5 %   Platelets 146 (L) 150 - 400 K/uL   _0 (sdes,specrequest,cult,reptstatus)   )No results found for this or any previous visit (from the past 720 hour(s)).   Impression/Recommendation  Active Problems:   Headache   HIV disease   Darrell Ortiz is a 34 y.o. male with  HIV likely AIDS,  peripheral enhancing lesion in the right parietal lobe.  #1 Right parietal lobe lesion: Differential includes Primary CNS lymphoma, Toxoplasmosis (though not typical per radiology), cryptococcoma, brain abscess, tuberculoma  I will attempt LP today  Will plan on checking :  Opening pressure   And sending CSF for   Cell count with differential  CSF protein and glucose  CSF cryptococccal ag  CSF Toxoplasma PCR  CSF EBV PCR  CSF JC virus PCR  CSF MTB PCR  CSF for cytology  CSF culture  Will try to have lab centrifuge dedicated 8-21m of CSF and send sediment for fungal culture  ""  And for AFB culture  Unless can arrive at diagnosis via labs and or empiric therapy and not sure what empiric rx to try at this point, possibly toxo therapy if labs unrevealign he will assuredly need Neurosurgery help for Brain biopsy  #2 HIV/AIDS: will check HIV ultraquant, CD4, need to figure out  What is happening in CNS first and likely will need to delay therapy to avoid IRIS  I spent greater than 60 minutes with the patient including greater than 50% of time in face to face counsel of the patient and in  coordination of their care.    09/06/2014, 11:56 AM   Thank you so much for this interesting consult  [place](pager) [phone](  office) 09/06/2014, 11:56 AM  Rhina Brackett Dam 09/06/2014, 11:56 AM

## 2014-09-06 NOTE — H&P (Signed)
PCP:   No PCP Per Patient   Chief Complaint:  Headache  HPI: 34 year old male who   has a past medical history of HIV disease; Headache; and Noncompliance with medication regimen. Patient today came to the ED with chief complaint of headache which has been going on for past 3 weeks. Patient was seen in the ED a week ago and was given migraine cocktail with good results. Patient says that he felt better for couple of days and then again had the same headache. He says that headache starts behind the right eye and also has a transient neck stiffness especially in the morning. He denies any fever, but felt sweaty after he takes aspirin or Goody powders. He denies any blurred vision, no nausea vomiting or diarrhea. No chest pain In the ED CT scan of the head was done which showed focal white matter hypoattenuation at the right parietal lobe with mild loss of gray-white differentiation. Could be reflective of progressive multifocal leukodystrophy or underlying mass. MRI brain with and without contrast recommended.  Allergies:  No Known Allergies    Past Medical History  Diagnosis Date  . HIV disease   . Headache   . Noncompliance with medication regimen     History reviewed. No pertinent past surgical history.  Prior to Admission medications   Medication Sig Start Date End Date Taking? Authorizing Provider  ibuprofen (ADVIL,MOTRIN) 600 MG tablet Take 600 mg by mouth every 6 (six) hours as needed.   Yes Historical Provider, MD  AFLURIA PRESERVATIVE FREE 0.5 ML SUSY once.  04/25/14   Historical Provider, MD  docusate sodium (COLACE) 100 MG capsule Take 1 capsule (100 mg total) by mouth every 12 (twelve) hours. Patient not taking: Reported on 06/08/2014 07/02/13   Joya Gaskins, MD  metroNIDAZOLE (FLAGYL) 500 MG tablet Take 1 tablet (500 mg total) by mouth 2 (two) times daily. One po bid x 7 days Patient not taking: Reported on 06/08/2014 07/02/13   Joya Gaskins, MD    oxyCODONE-acetaminophen (PERCOCET/ROXICET) 5-325 MG per tablet Take 2 tablets by mouth every 4 (four) hours as needed for severe pain. Patient not taking: Reported on 06/08/2014 07/02/13   Joya Gaskins, MD  phenylephrine (SUDAFED PE) 10 MG TABS tablet Take 10-20 mg by mouth every 4 (four) hours as needed (for congestion).    Historical Provider, MD    Social History:  reports that he has been smoking Cigarettes.  He has a 40 pack-year smoking history. His smokeless tobacco use includes Snuff. He reports that he drinks alcohol. He reports that he uses illicit drugs (Marijuana).  Family History  Problem Relation Age of Onset  . Cancer Other   . Diabetes Other      All the positives are listed in BOLD  Review of Systems:  HEENT: Headache, blurred vision, runny nose, sore throat Neck: Hypothyroidism, hyperthyroidism,,lymphadenopathy Chest : Shortness of breath, history of COPD, Asthma Heart : Chest pain, history of coronary arterey disease GI:  Nausea, vomiting, diarrhea, constipation, GERD GU: Dysuria, urgency, frequency of urination, hematuria Neuro: Stroke, seizures, syncope Psych: Depression, anxiety, hallucinations   Physical Exam: Blood pressure 151/92, pulse 73, temperature 98.4 F (36.9 C), temperature source Oral, resp. rate 18, height  (1.753 m), weight 88.451 kg (195 lb), SpO2 96 %. Constitutional:   Patient is a well-developed and well-nourished male* in no acute distress and cooperative with exam. Head: Normocephalic and atraumatic Mouth: Mucus membranes moist Eyes: PERRL, EOMI, conjunctivae normal Neck: Supple,  No Thyromegaly Cardiovascular: RRR, S1 normal, S2 normal Pulmonary/Chest: CTAB, no wheezes, rales, or rhonchi Abdominal: Soft. Non-tender, non-distended, bowel sounds are normal, no masses, organomegaly, or guarding present.  Neurological: A&O x3, Strength is normal and symmetric bilaterally, cranial nerve II-XII are grossly intact, no focal motor  deficit, sensory intact to light touch bilaterally.  Extremities : No Cyanosis, Clubbing or Edema  Labs on Admission:  Basic Metabolic Panel:  Recent Labs Lab 09/06/14 0500  NA 135  K 3.9  CL 109  CO2 25  GLUCOSE 98  BUN 9  CREATININE 0.64  CALCIUM 8.2*   Liver Function Tests:  Recent Labs Lab 09/06/14 0500  AST 20  ALT 39  ALKPHOS 57  BILITOT 0.4  PROT 6.5  ALBUMIN 3.2*   No results for input(s): LIPASE, AMYLASE in the last 168 hours. No results for input(s): AMMONIA in the last 168 hours. CBC: No results for input(s): WBC, NEUTROABS, HGB, HCT, MCV, PLT in the last 168 hours. Cardiac Enzymes: No results for input(s): CKTOTAL, CKMB, CKMBINDEX, TROPONINI in the last 168 hours.  BNP (last 3 results) No results for input(s): BNP in the last 8760 hours.  ProBNP (last 3 results) No results for input(s): PROBNP in the last 8760 hours.  CBG: No results for input(s): GLUCAP in the last 168 hours.  Radiological Exams on Admission: Ct Head Wo Contrast  09/06/2014   CLINICAL DATA:  Subacute onset of headache for 2 weeks. Current history of HIV. Initial encounter.  EXAM: CT HEAD WITHOUT CONTRAST  TECHNIQUE: Contiguous axial images were obtained from the base of the skull through the vertex without intravenous contrast.  COMPARISON:  CT of the head performed 06/08/2014  FINDINGS: There is no evidence of intra- or extra-axial hemorrhage on CT.  Focal decreased white matter attenuation is noted at the right parietal lobe, with mild loss of gray-white differentiation. Given the history of HIV, this could reflect progressive multifocal leukodystrophy, or possibly an underlying mass. A small abscess could conceivably have a similar appearance, if the patient has corresponding symptoms.  The posterior fossa, including the cerebellum, brainstem and fourth ventricle, is within normal limits. The third and lateral ventricles, and basal ganglia are unremarkable in appearance. No midline  shift is seen.  There is no evidence of fracture; visualized osseous structures are unremarkable in appearance. The visualized portions of the orbits are within normal limits. The paranasal sinuses and mastoid air cells are well-aerated. No significant soft tissue abnormalities are seen.  IMPRESSION: Focal white matter hypoattenuation at the right parietal lobe, with mild loss of gray-white differentiation. Given the history of HIV, this could reflect progressive multifocal leukodystrophy, or possibly an underlying mass. A small abscess could conceivably have a similar appearance, if the patient has corresponding symptoms.  MRI of the brain with and without contrast is recommended for further evaluation; would also correlate with the patient's CD4 count.  These results were called by telephone at the time of interpretation on 09/06/2014 at 4:30 am to Dr. Geoffery Lyons, who verbally acknowledged these results.   Electronically Signed   By: Roanna Raider M.D.   On: 09/06/2014 04:30       Assessment/Plan Active Problems:   Headache   HIV    Headache CT head shows possibility of mass versus abscess versus multifocal leukodystrophy. Infectious disease was consulted by the ED physician, and Dr. Algis Liming [facility]. CD4 count as well as MRI brain with and without contrast has  been ordered. Continue Dilaudid 2 mg IV every 4 hours when necessary  HIV Patient has not been taking the antiretroviral therapy for past 4 years. CD4 count ordered. Further recommendations as per infectious disease  DVT prophylaxis Lovenox   Code status: Full code  Family discussion: No family at bedside   Time Spent on Admission: 45 minutes  Jsiah Menta S [facility] Pager: [phone] 09/06/2014, 5:44 AM  If 7PM-7AM, please contact night-coverage  [email or web address]  Password [email or web address]

## 2014-09-06 NOTE — ED Provider Notes (Signed)
CSN: [id number]     Arrival date & time 09/06/14  [id number] History   None    Chief Complaint  Patient presents with  . Headache     (Consider location/radiation/quality/duration/timing/severity/associated sxs/prior Treatment) HPI Comments: Patient is a 34 year old male with history of HIV disease. He presents with complaints of headache that has been ongoing for the past several weeks. He was seen here a little over a week ago and was given a migraine cocktail with good results. He felt better for a couple of days, then his headache returned. He presents here again for evaluation of this. He denies any fevers or chills. He denies any aura. He does report some discomfort in his neck.  Patient is a 34 y.o. male presenting with headaches. The history is provided by the patient.  Headache Pain location:  Frontal and occipital Quality:  Dull Onset quality:  Gradual Duration:  3 weeks Timing:  Constant Progression:  Unchanged Chronicity:  New Similar to prior headaches: no   Context: activity and bright light   Relieved by:  Nothing Worsened by:  Nothing Ineffective treatments:  None tried   Past Medical History  Diagnosis Date  . HIV disease   . Headache   . Noncompliance with medication regimen    History reviewed. No pertinent past surgical history. Family History  Problem Relation Age of Onset  . Cancer Other   . Diabetes Other    History  Substance Use Topics  . Smoking status: Current Some Day Smoker -- 2.00 packs/day for 20 years    Types: Cigarettes    Last Attempt to Quit: 06/07/2013  . Smokeless tobacco: Current User    Types: Snuff  . Alcohol Use: Yes     Comment: occasional    Review of Systems  Neurological: Positive for headaches.  All other systems reviewed and are negative.     Allergies  Review of patient's allergies indicates no known allergies.  Home Medications   Prior to Admission medications   Medication Sig Start Date End Date Taking?  Authorizing Provider  ibuprofen (ADVIL,MOTRIN) 600 MG tablet Take 600 mg by mouth every 6 (six) hours as needed.   Yes Historical Provider, MD  AFLURIA PRESERVATIVE FREE 0.5 ML SUSY once.  04/25/14   Historical Provider, MD  docusate sodium (COLACE) 100 MG capsule Take 1 capsule (100 mg total) by mouth every 12 (twelve) hours. Patient not taking: Reported on 06/08/2014 07/02/13   Joya Gaskinsonald W Wickline, MD  metroNIDAZOLE (FLAGYL) 500 MG tablet Take 1 tablet (500 mg total) by mouth 2 (two) times daily. One po bid x 7 days Patient not taking: Reported on 06/08/2014 07/02/13   Joya Gaskinsonald W Wickline, MD  oxyCODONE-acetaminophen (PERCOCET/ROXICET) 5-325 MG per tablet Take 2 tablets by mouth every 4 (four) hours as needed for severe pain. Patient not taking: Reported on 06/08/2014 07/02/13   Joya Gaskinsonald W Wickline, MD  phenylephrine (SUDAFED PE) 10 MG TABS tablet Take 10-20 mg by mouth every 4 (four) hours as needed (for congestion).    Historical Provider, MD   BP 158/101 mmHg  Pulse 107  Temp(Src) 98.4 F (36.9 C) (Oral)  Resp 18  Ht 5\' 9"  (1.753 m)  Wt 195 lb (88.451 kg)  BMI 28.78 kg/m2  SpO2 100% Physical Exam  Constitutional: He is oriented to person, place, and time. He appears well-developed and well-nourished. No distress.  HENT:  Head: Normocephalic and atraumatic.  Mouth/Throat: Oropharynx is clear and moist.  Eyes: EOM are normal. Pupils are equal,  round, and reactive to light.  There is no papilledema on funduscopic exam.  Neck: Normal range of motion. Neck supple.  Cardiovascular: Normal rate, regular rhythm and normal heart sounds.   No murmur heard. Pulmonary/Chest: Effort normal and breath sounds normal. No respiratory distress. He has no wheezes.  Abdominal: Soft. Bowel sounds are normal.  Musculoskeletal: Normal range of motion. He exhibits no edema.  Lymphadenopathy:    He has no cervical adenopathy.  Neurological: He is alert and oriented to person, place, and time. No cranial nerve  deficit. He exhibits normal muscle tone. Coordination normal.  Skin: Skin is warm and dry. He is not diaphoretic.  Nursing note and vitals reviewed.   ED Course  Procedures (including critical care time) Labs Review Labs Reviewed - No data to display  Imaging Review No results found.   EKG Interpretation None      MDM   Final diagnoses:  None    Patient is a 34 year old male with history of HIV disease diagnosed in 2012. He has been followed by an infectious disease specialist in [place], but has been relatively untreated with medications since his diagnosis due to his own reluctance to take them. He presents tonight for the second time this week with complaints of headache. His headache improved with medications in the emergency department during his last visit and was discharged home. His headache returned shortly thereafter and returns for evaluation of this.  He is neurologically intact and afebrile. Today's workup reveals a CT scan that shows focal white matter hypoattenuation in the right parietal lobe concerning for either PML or underlying mass or abscess. I've discussed this finding with Dr. Zenaida Niece dam from infectious disease who feels as though the patient should be transferred to [facility] for further evaluation of this. He will require a CD4 count and MRI of the brain with and without contrast. He may also require a lumbar puncture, however I will defer this until the MRI is complete.  I have spoken with Dr. Sharl Ma from the hospitalist service who will arrange transfer to [facility].    Geoffery Lyons, MD 09/06/14 [phone]

## 2014-09-07 ENCOUNTER — Inpatient Hospital Stay (HOSPITAL_COMMUNITY): Payer: BLUE CROSS/BLUE SHIELD

## 2014-09-07 DIAGNOSIS — Z9119 Patient's noncompliance with other medical treatment and regimen: Secondary | ICD-10-CM

## 2014-09-07 DIAGNOSIS — A523 Neurosyphilis, unspecified: Secondary | ICD-10-CM | POA: Diagnosis present

## 2014-09-07 DIAGNOSIS — G939 Disorder of brain, unspecified: Secondary | ICD-10-CM

## 2014-09-07 DIAGNOSIS — B20 Human immunodeficiency virus [HIV] disease: Secondary | ICD-10-CM

## 2014-09-07 LAB — COMPREHENSIVE METABOLIC PANEL
ALK PHOS: 59 U/L (ref 39–117)
ALT: 31 U/L (ref 0–53)
ANION GAP: 4 — AB (ref 5–15)
AST: 18 U/L (ref 0–37)
Albumin: 3.2 g/dL — ABNORMAL LOW (ref 3.5–5.2)
BUN: 8 mg/dL (ref 6–23)
CHLORIDE: 104 mmol/L (ref 96–112)
CO2: 30 mmol/L (ref 19–32)
Calcium: 8.8 mg/dL (ref 8.4–10.5)
Creatinine, Ser: 0.64 mg/dL (ref 0.50–1.35)
GFR calc Af Amer: >90 mL/min (ref >90)
GFR calc non Af Amer: >90 mL/min (ref >90)
GLUCOSE: 94 mg/dL (ref 70–99)
Potassium: 4 mmol/L (ref 3.5–5.1)
SODIUM: 138 mmol/L (ref 135–145)
Total Bilirubin: 0.5 mg/dL (ref 0.3–1.2)
Total Protein: 6.1 g/dL (ref 6.0–8.3)

## 2014-09-07 LAB — CRYPTOCOCCAL ANTIGEN, CSF: Crypto Ag: NEGATIVE

## 2014-09-07 LAB — CBC
HCT: 38.4 % — ABNORMAL LOW (ref 39.0–52.0)
HEMOGLOBIN: 12.9 g/dL — AB (ref 13.0–17.0)
MCH: 32.9 pg (ref 26.0–34.0)
MCHC: 33.6 g/dL (ref 30.0–36.0)
MCV: 98 fL (ref 78.0–100.0)
PLATELETS: 148 10*3/uL — AB (ref 150–400)
RBC: 3.92 MIL/uL — ABNORMAL LOW (ref 4.22–5.81)
RDW: 12.2 % (ref 11.5–15.5)
WBC: 3.7 10*3/uL — ABNORMAL LOW (ref 4.0–10.5)

## 2014-09-07 LAB — CSF CELL COUNT WITH DIFFERENTIAL
Lymphs, CSF: 89 % — ABNORMAL HIGH (ref 40–80)
MONOCYTE-MACROPHAGE-SPINAL FLUID: 11 % — AB (ref 15–45)
RBC Count, CSF: 18 /mm3 — ABNORMAL HIGH
Tube #: 3
WBC, CSF: 408 /mm3 (ref 0–5)

## 2014-09-07 LAB — GRAM STAIN

## 2014-09-07 LAB — PROTEIN, CSF: TOTAL PROTEIN, CSF: 165 mg/dL — AB (ref 15–45)

## 2014-09-07 LAB — GLUCOSE, CSF: GLUCOSE CSF: 35 mg/dL — AB (ref 43–76)

## 2014-09-07 MED ORDER — MORPHINE SULFATE 2 MG/ML IJ SOLN
2.0000 mg | INTRAMUSCULAR | Status: DC | PRN
  Administered 2014-09-07: 2 mg via INTRAVENOUS
  Filled 2014-09-07: qty 1

## 2014-09-07 MED ORDER — MORPHINE SULFATE 4 MG/ML IJ SOLN
4.0000 mg | INTRAMUSCULAR | Status: DC | PRN
  Administered 2014-09-07 (×2): 2 mg via INTRAVENOUS
  Administered 2014-09-08 (×2): 4 mg via INTRAVENOUS
  Filled 2014-09-07 (×3): qty 1

## 2014-09-07 MED ORDER — ENOXAPARIN SODIUM 40 MG/0.4ML ~~LOC~~ SOLN
40.0000 mg | SUBCUTANEOUS | Status: DC
  Administered 2014-09-08 – 2014-09-11 (×4): 40 mg via SUBCUTANEOUS
  Filled 2014-09-07 (×4): qty 0.4

## 2014-09-07 NOTE — Procedures (Signed)
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  Technique:  The L3-4 level thecal sac was accessed left lateral decubitus with a 22G needle.    Opening pressure: 33 cm H2O.  Sample: ~25 cc clear CSF collected.  Complications: None

## 2014-09-07 NOTE — Progress Notes (Signed)
[facility]    Subjective: No new complaints is sp LP and fels better after LP   Antibiotics:  Anti-infectives    None      Medications: Scheduled Meds: . [START ON 09/08/2014] enoxaparin (LOVENOX) injection  40 mg Subcutaneous Q24H   Continuous Infusions: . sodium chloride 75 mL/hr at 09/07/14 1451   PRN Meds:.morphine injection, ondansetron **OR** ondansetron (ZOFRAN) IV    Objective: Weight change: 5 lb (2.268 kg)  Intake/Output Summary (Last 24 hours) at 09/07/14 1912 Last data filed at 09/07/14 1833  Gross per 24 hour  Intake   1680 ml  Output      0 ml  Net   1680 ml   Blood pressure 127/82, pulse 66, temperature 97.3 F (36.3 C), temperature source Oral, resp. rate 16, height 5\' 9"  (1.753 m), weight 200 lb (90.719 kg), SpO2 100 %. Temp:  [97.3 F (36.3 C)-98.1 F (36.7 C)] 97.3 F (36.3 C) (02/13 1400) Pulse Rate:  [66-86] 66 (02/13 1400) Resp:  [16-17] 16 (02/13 1400) BP: (117-134)/(75-82) 127/82 mmHg (02/13 1400) SpO2:  [96 %-100 %] 100 % (02/13 1400)  Physical Exam: General: Alert and awake, oriented x3, not in any acute distress. HEENT:  EOMI CVS regular rate, normal r,  no murmur rubs or gallops Chest: clear to auscultation bilaterally, no wheezing, rales or rhonchi Abdomen: soft nontender, nondistended, normal bowel sounds, Extremities: no  clubbing or edema noted bilaterally Skin: no rashes Neuro: nonfocal  CBC:  CBC Latest Ref Rng 09/07/2014 09/06/2014 09/06/2014  WBC 4.0 - 10.5 K/uL 3.7(L) 2.7(L) 3.2(L)  Hemoglobin 13.0 - 17.0 g/dL 12.9(L) 14.1 12.0(L)  Hematocrit 39.0 - 52.0 % 38.4(L) 41.1 35.7(L)  Platelets 150 - 400 K/uL 148(L) 146(L) 116(L)       BMET  Recent Labs  09/06/14 0500 09/07/14 0420  NA 135 138  K 3.9 4.0  CL 109 104  CO2 25 30  GLUCOSE 98 94  BUN 9 8  CREATININE 0.64 0.64  CALCIUM 8.2* 8.8     Liver Panel   Recent Labs  09/06/14 0500 09/07/14 0420  PROT 6.5 6.1    ALBUMIN 3.2* 3.2*  AST 20 18  ALT 39 31  ALKPHOS 57 59  BILITOT 0.4 0.5       Sedimentation Rate No results for input(s): ESRSEDRATE in the last 72 hours. C-Reactive Protein No results for input(s): CRP in the last 72 hours.  Micro Results: Recent Results (from the past 720 hour(s))  Gram stain     Status: None   Collection Time: 09/07/14  2:05 PM  Result Value Ref Range Status   Specimen Description CSF  Final   Special Requests NONE  Final   Gram Stain   Final    CYTOSPIN SLIDE WBC PRESENT, PREDOMINANTLY MONONUCLEAR NO ORGANISMS SEEN    Report Status 09/07/2014 FINAL  Final    Studies/Results: Ct Head Wo Contrast  09/06/2014   CLINICAL DATA:  Subacute onset of headache for 2 weeks. Current history of HIV. Initial encounter.  EXAM: CT HEAD WITHOUT CONTRAST  TECHNIQUE: Contiguous axial images were obtained from the base of the skull through the vertex without intravenous contrast.  COMPARISON:  CT of the head performed 06/08/2014  FINDINGS: There is no evidence of intra- or extra-axial hemorrhage on CT.  Focal decreased white matter attenuation is noted at the right parietal lobe, with mild loss of gray-white differentiation. Given the history of HIV, this could reflect progressive multifocal leukodystrophy,  or possibly an underlying mass. A small abscess could conceivably have a similar appearance, if the patient has corresponding symptoms.  The posterior fossa, including the cerebellum, brainstem and fourth ventricle, is within normal limits. The third and lateral ventricles, and basal ganglia are unremarkable in appearance. No midline shift is seen.  There is no evidence of fracture; visualized osseous structures are unremarkable in appearance. The visualized portions of the orbits are within normal limits. The paranasal sinuses and mastoid air cells are well-aerated. No significant soft tissue abnormalities are seen.  IMPRESSION: Focal white matter hypoattenuation at the right  parietal lobe, with mild loss of gray-white differentiation. Given the history of HIV, this could reflect progressive multifocal leukodystrophy, or possibly an underlying mass. A small abscess could conceivably have a similar appearance, if the patient has corresponding symptoms.  MRI of the brain with and without contrast is recommended for further evaluation; would also correlate with the patient's CD4 count.  These results were called by telephone at the time of interpretation on 09/06/2014 at 4:30 am to Dr. Geoffery Lyons, who verbally acknowledged these results.   Electronically Signed   By: Roanna Raider M.D.   On: 09/06/2014 04:30   Mr Laqueta Jean ZO Contrast  09/06/2014   CLINICAL DATA:  HIV.  Headache.  Abnormal CT.  EXAM: MRI HEAD WITHOUT AND WITH CONTRAST  TECHNIQUE: Multiplanar, multiecho pulse sequences of the brain and surrounding structures were obtained without and with intravenous contrast.  CONTRAST:  19mL MULTIHANCE GADOBENATE DIMEGLUMINE 529 MG/ML IV SOLN  COMPARISON:  CT head 09/06/2014, 06/08/2014  FINDINGS: Moderate edema in the right parietal white matter as noted on CT. 10 x 12 mm enhancing lesion in the right parietal lobe. This is peripherally located and lobular in shape and shows homogeneous enhancement without necrosis. It is difficult to determine if this is a peripheral intra-axial lesion versus a dural based lesion. No dural cleft is seen and this may represent an intra-axial lesion. There is slight enhancement of the adjacent dura. This lesion does not show restricted diffusion or hemorrhage. No other enhancing lesions are seen.  The remainder of the brain is normal. Ventricle size is normal. No shift of the midline structures. Negative for acute or chronic infarction.  Calvarium is intact. No bony lesion is identified on the recent CT in the right parietal bone.  Paranasal sinuses are clear.  IMPRESSION: 10 x 12 mm peripheral enhancing lesion in the right parietal lobe. It is  difficult determine if this is intra-axial or extra-axial however it may have an element of both. Differential diagnosis includes neoplasm such as lymphoma and Kaposi sarcoma. Metastatic disease and glioma considered less likely. Infection also considered. This is not a typical appearance for toxoplasmosis. Other fungal and tuberculosis infections are considerations.   Electronically Signed   By: Marlan Palau M.D.   On: 09/06/2014 09:38      Assessment/Plan:  Active Problems:   Headache   HIV disease   Cephalalgia   AIDS   Right parietal lobe lesion   Noncompliance    Darrell Ortiz is a 34 y.o. male with HIV likely AIDS, peripheral enhancing lesion in the right parietal lobe now found to have high opening pressure, CSF with 400+ WBC< lymphocytic predominant with low glucose and high protein. Crypto ag is negative. His CD4 is 170  #1 Right parietal lobe lesion:  Differential includes Primary CNS lymphoma, metastases, Tuberculoma, Fungal infection such as histoplasma or blastomyces, not crypto since antigen is negative.  Toxoplasmosis possible as well (though not typical per radiology)   CSF Toxoplasma PCR, CSF EBV PCR, CSF JC virus PCR are pending, CSF AFB, fungal and bacterial cultures pending   I WOULD LIKE TO MAKE SURE THAT WE SENT FOR  CSF MTB PCR  CSF for cytology  IF WE HAVE REMAINING CSF, I WILL TRY TO SEND HISTOPLASMA ANTIGEN TO [facility], ALONG WITH BLASTOMCYES ANTIGEN ON CSF, RECHECK HIS LIFETIME TRAVEL HX IN CASE HE HAS BEEN IN [place]  WOULD CHECK QF GOLD   Unless can arrive at diagnosis via labs and or empiric therapy and not sure what empiric rx to try at this point, possibly toxo therapy if labs unrevealign he will assuredly need Neurosurgery help for Brain biopsy  #2 HIV/AIDS: will check HIV ultraquant, CD4, need to figure out What is happening in CNS first and likely will need to delay therapy to avoid IRIS       LOS: 1 day   Acey Lav 09/07/2014, 7:12 PM

## 2014-09-07 NOTE — Progress Notes (Signed)
Patient Demographics  Darrell Ortiz, is a 34 y.o. male, DOB - 01-10-1981, ZOX:[id number]  Admit date - 09/06/2014   Admitting Physician Darrell Clos, MD  Outpatient Primary MD for the patient is No PCP Per Patient  LOS - 1   Chief Complaint  Patient presents with  . Headache      Admission history of present illness/brief narrative: 34 year old male who  has a past medical history of HIV disease; Headache; and Noncompliance with medication regimen. Patient today came to the ED with chief complaint of headache which has been going on for past 3 weeks. Patient was seen in the ED a week ago and was given migraine cocktail with good results. Patient says that he felt better for couple of days and then again had the same headache. He says that headache starts behind the right eye and also has a transient neck stiffness especially in the morning. He denies any fever,  blurred vision, no nausea vomiting or diarrhea. No chest pain In the ED CT scan of the head was done which showed focal white matter hypoattenuation at the right parietal lobe with mild loss of gray-white differentiation. Could be reflective of progressive multifocal leukodystrophy or underlying mass. MRI brain with and without contrast done and showing 10 x 12 mm peripheral enhancing lesions in the right periatrial lobe. Subjective:   Darrell Ortiz today has,No chest pain, No abdominal pain - No Nausea, No new weakness tingling or numbness, No Cough - SOB. Still complains of headache.  Assessment & Plan    Active Problems:   Headache   HIV disease   Cephalalgia   AIDS   Right parietal lobe lesion   Noncompliance  Headache /Right parietal lobe lesion: -Differential includes Primary CNS lymphoma, Toxoplasmosis (though not typical per radiology), cryptococcoma, brain abscess, tuberculoma - Lumbar  puncture done today by IR, with opening pressure of 33, workup is pending including cryptococcal antigen, toxoplasmosis PCR, EBV PCR, GC virus PCR, MTB PCR, cytology and culture. - If workup is nonrevealing will need neurosurgery consult for biopsy as discussed with ID. - Infectious disease is following.  HIV/AIDS - Infectious disease following - D4 count is 170  Code Status: Full  Family Communication: None at bedside  Disposition Plan: Pending workup   Procedures  LP 2/13   Consults   Infectious disease   Medications  Scheduled Meds:  Continuous Infusions: . sodium chloride 75 mL/hr at 09/07/14 1451   PRN Meds:.morphine injection, ondansetron **OR** ondansetron (ZOFRAN) IV  DVT Prophylaxis  Lovenox -  Lab Results  Component Value Date   PLT 148* 09/07/2014    Antibiotics    Anti-infectives    None          Objective:   Filed Vitals:   09/06/14 0701 09/06/14 1400 09/06/14 2104 09/07/14 0459  BP: 139/93 140/92 117/75 134/81  Pulse: 74 76 86 72  Temp: 98.4 F (36.9 C) 98 F (36.7 C) 98.1 F (36.7 C) 97.9 F (36.6 C)  TempSrc: Oral Oral Oral Oral  Resp: Height:  (1.753 m)     Weight: 90.719 kg (200 lb)     SpO2: 96% 98% 97% 96%    Wt Readings from Last 3  Encounters:  09/06/14 90.719 kg (200 lb)  08/29/14 90.266 kg (199 lb)  06/08/14 97.523 kg (215 lb)     Intake/Output Summary (Last 24 hours) at 09/07/14 1516 Last data filed at 09/07/14 1100  Gross per 24 hour  Intake   1200 ml  Output      0 ml  Net   1200 ml     Physical Exam  Awake Alert, Oriented X 3, No new F.N deficits, Normal affect [place].AT,PERRAL Supple Neck,No JVD, No cervical lymphadenopathy appriciated.  Symmetrical Chest wall movement, Good air movement bilaterally, CTAB RRR,No Gallops,Rubs or new Murmurs, No Parasternal Heave +ve B.Sounds, Abd Soft, No tenderness, No organomegaly appriciated, No rebound - guarding or rigidity. No Cyanosis, Clubbing or  edema, No new Rash or bruise     Data Review   Micro Results No results found for this or any previous visit (from the past 240 hour(s)).  Radiology Reports Ct Head Wo Contrast  09/06/2014   CLINICAL DATA:  Subacute onset of headache for 2 weeks. Current history of HIV. Initial encounter.  EXAM: CT HEAD WITHOUT CONTRAST  TECHNIQUE: Contiguous axial images were obtained from the base of the skull through the vertex without intravenous contrast.  COMPARISON:  CT of the head performed 06/08/2014  FINDINGS: There is no evidence of intra- or extra-axial hemorrhage on CT.  Focal decreased white matter attenuation is noted at the right parietal lobe, with mild loss of gray-white differentiation. Given the history of HIV, this could reflect progressive multifocal leukodystrophy, or possibly an underlying mass. A small abscess could conceivably have a similar appearance, if the patient has corresponding symptoms.  The posterior fossa, including the cerebellum, brainstem and fourth ventricle, is within normal limits. The third and lateral ventricles, and basal ganglia are unremarkable in appearance. No midline shift is seen.  There is no evidence of fracture; visualized osseous structures are unremarkable in appearance. The visualized portions of the orbits are within normal limits. The paranasal sinuses and mastoid air cells are well-aerated. No significant soft tissue abnormalities are seen.  IMPRESSION: Focal white matter hypoattenuation at the right parietal lobe, with mild loss of gray-white differentiation. Given the history of HIV, this could reflect progressive multifocal leukodystrophy, or possibly an underlying mass. A small abscess could conceivably have a similar appearance, if the patient has corresponding symptoms.  MRI of the brain with and without contrast is recommended for further evaluation; would also correlate with the patient's CD4 count.  These results were called by telephone at the time of  interpretation on 09/06/2014 at 4:30 am to Dr. Geoffery Ortiz, who verbally acknowledged these results.   Electronically Signed   By: Roanna Raider M.D.   On: 09/06/2014 04:30   Mr Darrell Ortiz ZO Contrast  09/06/2014   CLINICAL DATA:  HIV.  Headache.  Abnormal CT.  EXAM: MRI HEAD WITHOUT AND WITH CONTRAST  TECHNIQUE: Multiplanar, multiecho pulse sequences of the brain and surrounding structures were obtained without and with intravenous contrast.  CONTRAST:  19mL MULTIHANCE GADOBENATE DIMEGLUMINE 529 MG/ML IV SOLN  COMPARISON:  CT head 09/06/2014, 06/08/2014  FINDINGS: Moderate edema in the right parietal white matter as noted on CT. 10 x 12 mm enhancing lesion in the right parietal lobe. This is peripherally located and lobular in shape and shows homogeneous enhancement without necrosis. It is difficult to determine if this is a peripheral intra-axial lesion versus a dural based lesion. No dural cleft is seen and this may represent an intra-axial lesion. There  is slight enhancement of the adjacent dura. This lesion does not show restricted diffusion or hemorrhage. No other enhancing lesions are seen.  The remainder of the brain is normal. Ventricle size is normal. No shift of the midline structures. Negative for acute or chronic infarction.  Calvarium is intact. No bony lesion is identified on the recent CT in the right parietal bone.  Paranasal sinuses are clear.  IMPRESSION: 10 x 12 mm peripheral enhancing lesion in the right parietal lobe. It is difficult determine if this is intra-axial or extra-axial however it may have an element of both. Differential diagnosis includes neoplasm such as lymphoma and Kaposi sarcoma. Metastatic disease and glioma considered less likely. Infection also considered. This is not a typical appearance for toxoplasmosis. Other fungal and tuberculosis infections are considerations.   Electronically Signed   By: Marlan Palauharles  Clark M.D.   On: 09/06/2014 09:38    CBC  Recent Labs Lab  09/06/14 0500 09/06/14 0850 09/07/14 0420  WBC 3.2* 2.7* 3.7*  HGB 12.0* 14.1 12.9*  HCT 35.7* 41.1 38.4*  PLT 116* 146* 148*  MCV 98.6 97.9 98.0  MCH 33.1 33.6 32.9  MCHC 33.6 34.3 33.6  RDW 12.1 12.2 12.2  LYMPHSABS 1.0  --   --   MONOABS 0.3  --   --   EOSABS 0.1  --   --   BASOSABS 0.0  --   --     Chemistries   Recent Labs Lab 09/06/14 0500 09/07/14 0420  NA 135 138  K 3.9 4.0  CL 109 104  CO2 25 30  GLUCOSE 98 94  BUN 9 8  CREATININE 0.64 0.64  CALCIUM 8.2* 8.8  AST 20 18  ALT 39 31  ALKPHOS 57 59  BILITOT 0.4 0.5   ------------------------------------------------------------------------------------------------------------------ estimated creatinine clearance is 143.5 mL/min (by C-G formula based on Cr of 0.64). ------------------------------------------------------------------------------------------------------------------ No results for input(s): HGBA1C in the last 72 hours. ------------------------------------------------------------------------------------------------------------------ No results for input(s): CHOL, HDL, LDLCALC, TRIG, CHOLHDL, LDLDIRECT in the last 72 hours. ------------------------------------------------------------------------------------------------------------------ No results for input(s): TSH, T4TOTAL, T3FREE, THYROIDAB in the last 72 hours.  Invalid input(s): FREET3 ------------------------------------------------------------------------------------------------------------------ No results for input(s): VITAMINB12, FOLATE, FERRITIN, TIBC, IRON, RETICCTPCT in the last 72 hours.  Coagulation profile No results for input(s): INR, PROTIME in the last 168 hours.  No results for input(s): DDIMER in the last 72 hours.  Cardiac Enzymes No results for input(s): CKMB, TROPONINI, MYOGLOBIN in the last 168 hours.  Invalid input(s):  CK ------------------------------------------------------------------------------------------------------------------ Invalid input(s): POCBNP     Time Spent in minutes   35 minutes   Hazelee Harbold M.D on 09/07/2014 at 3:16 PM  Between 7am to 7pm - Pager - [phone]  After 7pm go to [email or web address] - password [email or web address]  And look for the night coverage person covering for me after hours  [facility] Office  [phone]   **Disclaimer: This note may have been dictated with voice recognition software. Similar sounding words can inadvertently be transcribed and this note may contain transcription errors which may not have been corrected upon publication of note.**

## 2014-09-07 NOTE — Progress Notes (Signed)
Back from IR.  2 band aides noted on lumbar area, both  CDI.

## 2014-09-08 ENCOUNTER — Inpatient Hospital Stay (HOSPITAL_COMMUNITY): Payer: BLUE CROSS/BLUE SHIELD

## 2014-09-08 DIAGNOSIS — G030 Nonpyogenic meningitis: Secondary | ICD-10-CM

## 2014-09-08 LAB — BASIC METABOLIC PANEL
Anion gap: 8 (ref 5–15)
BUN: 7 mg/dL (ref 6–23)
CHLORIDE: 101 mmol/L (ref 96–112)
CO2: 26 mmol/L (ref 19–32)
Calcium: 9.3 mg/dL (ref 8.4–10.5)
Creatinine, Ser: 0.73 mg/dL (ref 0.50–1.35)
GFR calc Af Amer: >90 mL/min (ref >90)
Glucose, Bld: 83 mg/dL (ref 70–99)
Potassium: 4.7 mmol/L (ref 3.5–5.1)
SODIUM: 135 mmol/L (ref 135–145)

## 2014-09-08 LAB — CBC
HEMATOCRIT: 37.4 % — AB (ref 39.0–52.0)
HEMOGLOBIN: 12.8 g/dL — AB (ref 13.0–17.0)
MCH: 33.3 pg (ref 26.0–34.0)
MCHC: 34.2 g/dL (ref 30.0–36.0)
MCV: 97.4 fL (ref 78.0–100.0)
PLATELETS: 148 10*3/uL — AB (ref 150–400)
RBC: 3.84 MIL/uL — ABNORMAL LOW (ref 4.22–5.81)
RDW: 12.3 % (ref 11.5–15.5)
WBC: 4.4 10*3/uL (ref 4.0–10.5)

## 2014-09-08 MED ORDER — POLYETHYLENE GLYCOL 3350 17 G PO PACK
17.0000 g | PACK | Freq: Every day | ORAL | Status: DC
  Administered 2014-09-08 – 2014-09-11 (×3): 17 g via ORAL
  Filled 2014-09-08 (×4): qty 1

## 2014-09-08 MED ORDER — KETOROLAC TROMETHAMINE 30 MG/ML IJ SOLN
30.0000 mg | Freq: Four times a day (QID) | INTRAMUSCULAR | Status: DC | PRN
  Administered 2014-09-08 – 2014-09-11 (×6): 30 mg via INTRAVENOUS
  Filled 2014-09-08 (×6): qty 1

## 2014-09-08 MED ORDER — TRAMADOL HCL 50 MG PO TABS
50.0000 mg | ORAL_TABLET | Freq: Four times a day (QID) | ORAL | Status: DC | PRN
  Administered 2014-09-08: 50 mg via ORAL
  Filled 2014-09-08 (×2): qty 1

## 2014-09-08 NOTE — Progress Notes (Signed)
[facility]    Subjective: No new complaints   Antibiotics:  Anti-infectives    None      Medications: Scheduled Meds: . enoxaparin (LOVENOX) injection  40 mg Subcutaneous Q24H  . polyethylene glycol  17 g Oral Daily   Continuous Infusions: . sodium chloride 75 mL/hr at 09/08/14 0406   PRN Meds:.ketorolac, morphine injection, ondansetron **OR** ondansetron (ZOFRAN) IV, traMADol    Objective: Weight change:   Intake/Output Summary (Last 24 hours) at 09/08/14 1837 Last data filed at 09/08/14 0900  Gross per 24 hour  Intake   1840 ml  Output      0 ml  Net   1840 ml   Blood pressure 135/84, pulse 65, temperature 98.1 F (36.7 C), temperature source Oral, resp. rate 18, height 5\' 9"  (1.753 m), weight 200 lb (90.719 kg), SpO2 99 %. Temp:  [98 F (36.7 C)-98.2 F (36.8 C)] 98.1 F (36.7 C) (02/14 1400) Pulse Rate:  [65-72] 65 (02/14 1400) Resp:  [15-18] 18 (02/14 1400) BP: (125-135)/(76-84) 135/84 mmHg (02/14 1400) SpO2:  [98 %-99 %] 99 % (02/14 1400)  Physical Exam: General: Alert and awake, oriented x3, not in any acute distress. HEENT:  EOMI CVS regular rate, normal r,  no murmur rubs or gallops Chest: clear to auscultation bilaterally, no wheezing, rales or rhonchi Abdomen: soft nontender, nondistended, normal bowel sounds, Extremities: no  clubbing or edema noted bilaterally Skin: no rashes Neuro: nonfocal  CBC:  CBC Latest Ref Rng 09/08/2014 09/07/2014 09/06/2014  WBC 4.0 - 10.5 K/uL 4.4 3.7(L) 2.7(L)  Hemoglobin 13.0 - 17.0 g/dL 12.8(L) 12.9(L) 14.1  Hematocrit 39.0 - 52.0 % 37.4(L) 38.4(L) 41.1  Platelets 150 - 400 K/uL 148(L) 148(L) 146(L)       BMET  Recent Labs  09/07/14 0420 09/08/14 0651  NA 138 135  K 4.0 4.7  CL 104 101  CO2 30 26  GLUCOSE 94 83  BUN 8 7  CREATININE 0.64 0.73  CALCIUM 8.8 9.3     Liver Panel   Recent Labs  09/06/14 0500 09/07/14 0420  PROT 6.5 6.1  ALBUMIN 3.2* 3.2*    AST 20 18  ALT 39 31  ALKPHOS 57 59  BILITOT 0.4 0.5       Sedimentation Rate No results for input(s): ESRSEDRATE in the last 72 hours. C-Reactive Protein No results for input(s): CRP in the last 72 hours.  Micro Results: Recent Results (from the past 720 hour(s))  Gram stain     Status: None   Collection Time: 09/07/14  2:05 PM  Result Value Ref Range Status   Specimen Description CSF  Final   Special Requests NONE  Final   Gram Stain   Final    CYTOSPIN SLIDE WBC PRESENT, PREDOMINANTLY MONONUCLEAR NO ORGANISMS SEEN    Report Status 09/07/2014 FINAL  Final    Studies/Results: Dg Chest Port 1 View  09/08/2014   CLINICAL DATA:  Headache.  No chest complaints.  Brain lesion.  HIV.  EXAM: PORTABLE CHEST - 1 VIEW  COMPARISON:  None.  FINDINGS: The heart size and mediastinal contours are within normal limits. Both lungs are clear. The visualized skeletal structures are unremarkable.  IMPRESSION: No active disease.   Electronically Signed   By: Norva PavlovElizabeth  Brown M.D.   On: 09/08/2014 08:51      Assessment/Plan:  Active Problems:   Headache   HIV disease   Cephalalgia   AIDS   Right parietal lobe lesion  Noncompliance   Brain lesion   Aseptic meningitis    Darrell Ortiz is a 34 y.o. male with HIV likely AIDS, peripheral enhancing lesion in the right parietal lobe now found to have high opening pressure, CSF with 400+ WBC< lymphocytic predominant with low glucose and high protein. Crypto ag is negative. His CD4 is 170  #1 Right parietal lobe lesion:  Differential includes Primary CNS lymphoma, metastases, Tuberculoma, Fungal infection such as histoplasma or blastomyces, not crypto since antigen is negative.  Toxoplasmosis possible as well (though not typical per radiology)   CSF Toxoplasma PCR, CSF EBV PCR, CSF JC virus PCR are pending, CSF AFB, fungal and bacterial cultures pending   I WOULD LIKE TO MAKE SURE THAT WE SENT FOR  CSF MTB PCR  CSF for  cytology  IF WE HAVE REMAINING CSF, I WILL TRY TO SEND   HISTOPLASMA ANTIGEN TO [facility], ALONG WITH BLASTOMCYES ANTIGEN ON CSF,   FOLLOWUP  QF GOLD   Unless can arrive at diagnosis via labs and or empiric therapy and not sure what empiric rx to try at this point, possibly toxo therapy if labs unrevealign he will assuredly need Neurosurgery help for Brain biopsy  #2 HIV/AIDS: will check HIV ultraquant,need to figure out What is happening in CNS first and likely will need to delay therapy to avoid IRIS       LOS: 2 days   Acey Lav 09/08/2014, 6:37 PM

## 2014-09-08 NOTE — Progress Notes (Signed)
Patient Demographics  Darrell BodoJonathan Ortiz, is a 34 y.o. male, DOB - September 08, 1980, ZOX:[id number]  Admit date - 09/06/2014   Admitting Physician Eduard ClosArshad N Kakrakandy, MD  Outpatient Primary MD for the patient is No PCP Per Patient  LOS - 2   Chief Complaint  Patient presents with  . Headache      Admission history of present illness/brief narrative: 34 year old male who  has a past medical history of HIV disease; Headache; and Noncompliance with medication regimen. Patient today came to the ED with chief complaint of headache which has been going on for past 3 weeks. Patient was seen in the ED a week ago and was given migraine cocktail with good results. Patient says that he felt better for couple of days and then again had the same headache. He says that headache starts behind the right eye and also has a transient neck stiffness especially in the morning. He denies any fever,  blurred vision, no nausea vomiting or diarrhea. No chest pain In the ED CT scan of the head was done which showed focal white matter hypoattenuation at the right parietal lobe with mild loss of gray-white differentiation. Could be reflective of progressive multifocal leukodystrophy or underlying mass. MRI brain with and without contrast done and showing 10 x 12 mm peripheral enhancing lesions in the right periatrial lobe.  Assessment & Plan   Headache /Right parietal lobe lesion: S/p LP. Multiple studies pending. Pain not well controlled. Tramadol and toradol prn per patient request.  HIV/AIDS CD4 count is 170 No ART for now  Code Status: Full  Family Communication: None at bedside  Disposition Plan:    Procedures  LP 2/13  Consults   Infectious disease  Medications  Scheduled Meds: . enoxaparin (LOVENOX) injection  40 mg Subcutaneous Q24H  . polyethylene glycol  17 g Oral Daily    Continuous Infusions: . sodium chloride 75 mL/hr at 09/08/14 0406   PRN Meds:.ketorolac, morphine injection, ondansetron **OR** ondansetron (ZOFRAN) IV, traMADol  DVT Prophylaxis  Lovenox -  Lab Results  Component Value Date   PLT 148* 09/08/2014    Antibiotics    Anti-infectives    None     Subjective:  C/o ha requesting toradol. Requested tramadol earlier. C/o constipation. Not much appetite today. photophobia  Objective:   Filed Vitals:   09/07/14 0459 09/07/14 1400 09/07/14 2059 09/08/14 0544  BP: 134/81 127/82 132/83 125/76  Pulse: 72 66 72 69  Temp: 97.9 F (36.6 C) 97.3 F (36.3 C) 98 F (36.7 C) 98.2 F (36.8 C)  TempSrc: Oral Oral Oral Oral  Resp: 17 16 15 16   Height:      Weight:      SpO2: 96% 100% 98% 98%    Wt Readings from Last 3 Encounters:  09/06/14 90.719 kg (200 lb)  08/29/14 90.266 kg (199 lb)  06/08/14 97.523 kg (215 lb)     Intake/Output Summary (Last 24 hours) at 09/08/14 1525 Last data filed at 09/08/14 0500  Gross per 24 hour  Intake   1840 ml  Output      0 ml  Net   1840 ml     Physical Exam  Gen: in darkened room. Uncomfortable appearing Supple Neck,No JVD, No cervical  lymphadenopathy lungs, CTAB without WRR CV RRR,No Gallops,Rubs or new Murmurs abd s, nt, nd No Cyanosis, Clubbing or edema,   Data Review   Micro Results Recent Results (from the past 240 hour(s))  Gram stain     Status: None   Collection Time: 09/07/14  2:05 PM  Result Value Ref Range Status   Specimen Description CSF  Final   Special Requests NONE  Final   Gram Stain   Final    CYTOSPIN SLIDE WBC PRESENT, PREDOMINANTLY MONONUCLEAR NO ORGANISMS SEEN    Report Status 09/07/2014 FINAL  Final    Radiology Reports Dg Chest Port 1 View  09/08/2014   CLINICAL DATA:  Headache.  No chest complaints.  Brain lesion.  HIV.  EXAM: PORTABLE CHEST - 1 VIEW  COMPARISON:  None.  FINDINGS: The heart size and mediastinal contours are within normal limits.  Both lungs are clear. The visualized skeletal structures are unremarkable.  IMPRESSION: No active disease.   Electronically Signed   By: Norva Pavlov M.D.   On: 09/08/2014 08:51    CBC  Recent Labs Lab 09/06/14 0500 09/06/14 0850 09/07/14 0420 09/08/14 0651  WBC 3.2* 2.7* 3.7* 4.4  HGB 12.0* 14.1 12.9* 12.8*  HCT 35.7* 41.1 38.4* 37.4*  PLT 116* 146* 148* 148*  MCV 98.6 97.9 98.0 97.4  MCH 33.1 33.6 32.9 33.3  MCHC 33.6 34.3 33.6 34.2  RDW 12.1 12.2 12.2 12.3  LYMPHSABS 1.0  --   --   --   MONOABS 0.3  --   --   --   EOSABS 0.1  --   --   --   BASOSABS 0.0  --   --   --     Chemistries   Recent Labs Lab 09/06/14 0500 09/07/14 0420 09/08/14 0651  NA 135 138 135  K 3.9 4.0 4.7  CL 109 104 101  CO2 GLUCOSE 98 94 83  BUN CREATININE 0.64 0.64 0.73  CALCIUM 8.2* 8.8 9.3  AST 20 18  --   ALT 39 31  --   ALKPHOS 57 59  --   BILITOT 0.4 0.5  --    ------------------------------------------------------------------------------------------------------------------ estimated creatinine clearance is 143.5 mL/min (by C-G formula based on Cr of 0.73). ------------------------------------------------------------------------------------------------------------------ No results for input(s): HGBA1C in the last 72 hours. ------------------------------------------------------------------------------------------------------------------ No results for input(s): CHOL, HDL, LDLCALC, TRIG, CHOLHDL, LDLDIRECT in the last 72 hours. ------------------------------------------------------------------------------------------------------------------ No results for input(s): TSH, T4TOTAL, T3FREE, THYROIDAB in the last 72 hours.  Invalid input(s): FREET3 ------------------------------------------------------------------------------------------------------------------ No results for input(s): VITAMINB12, FOLATE, FERRITIN, TIBC, IRON, RETICCTPCT in the last 72  hours.  Coagulation profile No results for input(s): INR, PROTIME in the last 168 hours.  No results for input(s): DDIMER in the last 72 hours.  Cardiac Enzymes No results for input(s): CKMB, TROPONINI, MYOGLOBIN in the last 168 hours.  Invalid input(s): CK ------------------------------------------------------------------------------------------------------------------ Invalid input(s): POCBNP  Time Spent in minutes   25 minutes   Christiane Ha M.D on 09/08/2014 at 3:25 PM  [email or web address] - password [email or web address]  [facility]

## 2014-09-08 NOTE — Progress Notes (Signed)
UR Completed.  [phone]  

## 2014-09-09 ENCOUNTER — Encounter (HOSPITAL_COMMUNITY): Payer: Self-pay | Admitting: General Practice

## 2014-09-09 DIAGNOSIS — K5900 Constipation, unspecified: Secondary | ICD-10-CM | POA: Clinically undetermined

## 2014-09-09 DIAGNOSIS — K5909 Other constipation: Secondary | ICD-10-CM

## 2014-09-09 LAB — PATHOLOGIST SMEAR REVIEW: Path Review: REACTIVE

## 2014-09-09 MED ORDER — SODIUM CHLORIDE 0.9 % IJ SOLN
3.0000 mL | Freq: Two times a day (BID) | INTRAMUSCULAR | Status: DC
Start: 2014-09-09 — End: 2014-09-11
  Administered 2014-09-09 – 2014-09-11 (×4): 3 mL via INTRAVENOUS

## 2014-09-09 MED ORDER — SODIUM CHLORIDE 0.9 % IJ SOLN: 3.0000 mL | INTRAMUSCULAR | Status: DC | PRN

## 2014-09-09 MED ORDER — DIPHENHYDRAMINE HCL 25 MG PO CAPS
25.0000 mg | ORAL_CAPSULE | Freq: Four times a day (QID) | ORAL | Status: DC | PRN
  Administered 2014-09-09 – 2014-09-10 (×2): 25 mg via ORAL
  Filled 2014-09-09 (×2): qty 1

## 2014-09-09 MED ORDER — SODIUM CHLORIDE 0.9 % IV SOLN: 250.0000 mL | INTRAVENOUS | Status: DC | PRN

## 2014-09-09 NOTE — Progress Notes (Signed)
[facility]           Subjective: No new complaints   Antibiotics:  Anti-infectives    None      Medications: Scheduled Meds: . enoxaparin (LOVENOX) injection  40 mg Subcutaneous Q24H  . polyethylene glycol  17 g Oral Daily  . sodium chloride  3 mL Intravenous Q12H   Continuous Infusions:   PRN Meds:.sodium chloride, ketorolac, morphine injection, ondansetron **OR** ondansetron (ZOFRAN) IV, sodium chloride, traMADol    Objective: Weight change:   Intake/Output Summary (Last 24 hours) at 09/09/14 1513 Last data filed at 09/09/14 1406  Gross per 24 hour  Intake   2388 ml  Output      0 ml  Net   2388 ml   Blood pressure 150/86, pulse 73, temperature 98.3 F (36.8 C), temperature source Oral, resp. rate 17, height 5\' 9"  (1.753 m), weight 200 lb (90.719 kg), SpO2 99 %. Temp:  [98.1 F (36.7 C)-99 F (37.2 C)] 98.3 F (36.8 C) (02/15 1301) Pulse Rate:  [71-74] 73 (02/15 1301) Resp:  [17] 17 (02/15 1301) BP: (113-150)/(64-86) 150/86 mmHg (02/15 1301) SpO2:  [96 %-99 %] 99 % (02/15 1301)  Physical Exam: General: Alert and awake, oriented x3, not in any acute distress. HEENT:  EOMI CVS regular rate, normal r,  no murmur rubs or gallops Chest: clear to auscultation bilaterally, no wheezing, rales or rhonchi Abdomen: soft nontender, nondistended, normal bowel sounds, Extremities: no  clubbing or edema noted bilaterally Skin: no rashes Neuro: nonfocal  CBC:  CBC Latest Ref Rng 09/08/2014 09/07/2014 09/06/2014  WBC 4.0 - 10.5 K/uL 4.4 3.7(L) 2.7(L)  Hemoglobin 13.0 - 17.0 g/dL 12.8(L) 12.9(L) 14.1  Hematocrit 39.0 - 52.0 % 37.4(L) 38.4(L) 41.1  Platelets 150 - 400 K/uL 148(L) 148(L) 146(L)       BMET  Recent Labs  09/07/14 0420 09/08/14 0651  NA 138 135  K 4.0 4.7  CL 104 101  CO2 30 26  GLUCOSE 94 83  BUN 8 7  CREATININE 0.64 0.73  CALCIUM 8.8 9.3     Liver Panel   Recent Labs  09/07/14 0420  PROT 6.1    ALBUMIN 3.2*  AST 18  ALT 31  ALKPHOS 59  BILITOT 0.5       Sedimentation Rate No results for input(s): ESRSEDRATE in the last 72 hours. C-Reactive Protein No results for input(s): CRP in the last 72 hours.  Micro Results: Recent Results (from the past 720 hour(s))  Fungus Culture with Smear     Status: None (Preliminary result)   Collection Time: 09/07/14  2:05 PM  Result Value Ref Range Status   Specimen Description CSF  Final   Special Requests NONE  Final   Fungal Smear   Final    NO YEAST OR FUNGAL ELEMENTS SEEN [facility]formed [facility]    Culture   Final    CULTURE IN PROGRESS FOR FOUR WEEKS Performed at [facility]    Report Status PENDING  Incomplete  Gram stain     Status: None   Collection Time: 09/07/14  2:05 PM  Result Value Ref Range Status   Specimen Description CSF  Final   Special Requests NONE  Final   Gram Stain   Final    CYTOSPIN SLIDE WBC PRESENT, PREDOMINANTLY MONONUCLEAR NO ORGANISMS SEEN    Report Status 09/07/2014 FINAL  Final  CSF culture     Status: None (Preliminary result)  Collection Time: 09/07/14  2:05 PM  Result Value Ref Range Status   Specimen Description CSF  Final   Special Requests NONE  Final   Gram Stain   Final    CYTOSPIN SLIDE WBC PRESENT, PREDOMINANTLY MONONUCLEAR NO ORGANISMS SEEN Performed at [facility] Performed at [facility]    Culture   Final    NO GROWTH 1 DAY Performed at [facility]    Report Status PENDING  Incomplete  AFB culture with smear     Status: None (Preliminary result)   Collection Time: 09/07/14  2:05 PM  Result Value Ref Range Status   Specimen Description CSF  Final   Special Requests NONE  Final   Acid Fast Smear   Final    NO ACID FAST BACILLI SEEN Performed at [facility]    Culture   Final    CULTURE WILL BE EXAMINED FOR 6 WEEKS BEFORE ISSUING A FINAL REPORT Performed at [facility]    Report Status PENDING   Incomplete    Studies/Results: Dg Chest Port 1 View  09/08/2014   CLINICAL DATA:  Headache.  No chest complaints.  Brain lesion.  HIV.  EXAM: PORTABLE CHEST - 1 VIEW  COMPARISON:  None.  FINDINGS: The heart size and mediastinal contours are within normal limits. Both lungs are clear. The visualized skeletal structures are unremarkable.  IMPRESSION: No active disease.   Electronically Signed   By: Norva Pavlov M.D.   On: 09/08/2014 08:51      Assessment/Plan:  Principal Problem:   Right parietal lobe lesion Active Problems:   Headache   HIV disease   Cephalalgia   AIDS   Noncompliance   Aseptic meningitis   Constipation    Darrell Ortiz is a 34 y.o. male with HIV likely AIDS, peripheral enhancing lesion in the right parietal lobe now found to have high opening pressure, CSF with 400+ WBC< lymphocytic predominant with low glucose and high protein. Crypto ag is negative. His CD4 is 170  #1 Right parietal lobe lesion:  Differential includes  Tuberculoma, Fungal infection such as histoplasma or blastomyces, not crypto since antigen is negative.   Toxoplasmosis possible as well (though not typical per radiology) AND CD4 WAS 170  Primary CNS lymphoma also seems less likely though possible as is other CNS malignancy  CSF Toxoplasma PCR, CSF EBV PCR, CSF JC virus PCR are pending, CSF AFB, fungal and bacterial cultures pending but negative on smear   I SPOKE WITH MICRO AND LAB AND THERE ARE AT LEAST 2 CC OF FLUID LEFT THAT CAN BE USED  I HAVE ASKED THAT THEY BE SENT IN ORDER OF PRIORITY:  MTB PCR  HISTOPLASMA ANTIGEN CSF TO [facility],  BLASTOMCYES ANTIGEN ON CSF TO [facility]   WE MAY WANT TO CONSIDER REPEAT LARGE VOLUME LP LATER THIS WEEK WITH FRESH CSF TO BE SENT TO PATHOLOGY LAB FOR FLOW CYTOMETRY  AS WELL AS LARGER VOLUME CSF TO BE COLLECTED IN PARTICULAR TO SET UP FUNGAL AND AFB CULTURES  Unless can arrive at diagnosis via labs and or empiric therapy and not sure  what empiric rx to try at this point, possibly toxo therapy if labs unrevealign he will assuredly need Neurosurgery help for Brain biopsy  #2 HIV/AIDS: will check HIV ultraquant,need to figure out What is happening in CNS first and likely will need to delay therapy to avoid IRIS   Dr. Orvan Falconer to take over the service in the am.  LOS: 3 days   Acey Lav 09/09/2014, 3:13 PM

## 2014-09-09 NOTE — Progress Notes (Signed)
Patient Demographics  Darrell Ortiz, is a 34 y.o. male, DOB - 26-Aug-1980, ZOX:[id number]  Admit date - 09/06/2014   Admitting Physician Eduard Clos, MD  Outpatient Primary MD for the patient is No PCP Per Patient  LOS - 3   Chief Complaint  Patient presents with  . Headache      Admission history of present illness/brief narrative: 34 year old male who  has a past medical history of HIV disease; Headache; and Noncompliance with medication regimen. Patient today came to the ED with chief complaint of headache which has been going on for past 3 weeks. Patient was seen in the ED a week ago and was given migraine cocktail with good results. Patient says that he felt better for couple of days and then again had the same headache. He says that headache starts behind the right eye and also has a transient neck stiffness especially in the morning. He denies any fever,  blurred vision, no nausea vomiting or diarrhea. No chest pain In the ED CT scan of the head was done which showed focal white matter hypoattenuation at the right parietal lobe with mild loss of gray-white differentiation. Could be reflective of progressive multifocal leukodystrophy or underlying mass. MRI brain with and without contrast done and showing 10 x 12 mm peripheral enhancing lesions in the right periatrial lobe.  Assessment & Plan   Headache /Right parietal lobe lesion: S/p LP. Multiple studies pending. HA better controlled with toradol prn. Last dose yesterday  HIV/AIDS CD4 count is 170 No ART for now  Constipation: started on miralax. Encouraged ambulation  Code Status: Full  Family Communication: None at bedside  Disposition Plan:    Procedures  LP 2/13  Consults   Infectious disease  Medications  Scheduled Meds: . enoxaparin (LOVENOX) injection  40 mg Subcutaneous Q24H   . polyethylene glycol  17 g Oral Daily   Continuous Infusions: . sodium chloride 75 mL/hr at 09/08/14 0406   PRN Meds:.ketorolac, morphine injection, ondansetron **OR** ondansetron (ZOFRAN) IV, traMADol  DVT Prophylaxis  Lovenox -  Lab Results  Component Value Date   PLT 148* 09/08/2014    Antibiotics    Anti-infectives    None     Subjective:  HA better after toradol yesterday. Eating better. Constipated.  Objective:   Filed Vitals:   09/08/14 0544 09/08/14 1400 09/08/14 2057 09/09/14 0448  BP: 125/76 135/84 113/64 130/79  Pulse: 69 65 71 74  Temp: 98.2 F (36.8 C) 98.1 F (36.7 C) 98.1 F (36.7 C) 99 F (37.2 C)  TempSrc: Oral Oral Oral Oral  Resp: Height:      Weight:      SpO2: 98% 99% 96% 97%    Wt Readings from Last 3 Encounters:  09/06/14 90.719 kg (200 lb)  08/29/14 90.266 kg (199 lb)  06/08/14 97.523 kg (215 lb)     Intake/Output Summary (Last 24 hours) at 09/09/14 1236 Last data filed at 09/09/14 0925  Gross per 24 hour  Intake   1908 ml  Output      0 ml  Net   1908 ml     Physical Exam  Gen: in darkened room. More comfortable Supple Neck,No JVD, No cervical lymphadenopathy  lungs, CTAB without WRR CV RRR,No Gallops,Rubs or new Murmurs abd s, nt, nd No Cyanosis, Clubbing or edema,   Data Review   Micro Results Recent Results (from the past 240 hour(s))  Fungus Culture with Smear     Status: None (Preliminary result)   Collection Time: 09/07/14  2:05 PM  Result Value Ref Range Status   Specimen Description CSF  Final   Special Requests NONE  Final   Fungal Smear   Final    NO YEAST OR FUNGAL ELEMENTS SEEN Performed at [facility]    Culture   Final    CULTURE IN PROGRESS FOR FOUR WEEKS Performed at [facility]    Report Status PENDING  Incomplete  Gram stain     Status: None   Collection Time: 09/07/14  2:05 PM  Result Value Ref Range Status   Specimen Description CSF  Final   Special  Requests NONE  Final   Gram Stain   Final    CYTOSPIN SLIDE WBC PRESENT, PREDOMINANTLY MONONUCLEAR NO ORGANISMS SEEN    Report Status 09/07/2014 FINAL  Final  CSF culture     Status: None (Preliminary result)   Collection Time: 09/07/14  2:05 PM  Result Value Ref Range Status   Specimen Description CSF  Final   Special Requests NONE  Final   Gram Stain   Final    CYTOSPIN SLIDE WBC PRESENT, PREDOMINANTLY MONONUCLEAR NO ORGANISMS SEEN [facility]    Culture   Final    NO GROWTH 1 DAY Performed at [facility]    Report Status PENDING  Incomplete  AFB culture with smear     Status: None (Preliminary result)   Collection Time: 09/07/14  2:05 PM  Result Value Ref Range Status   Specimen Description CSF  Final   Special Requests NONE  Final   Acid Fast Smear   Final    NO ACID FAST BACILLI SEEN Performed at [facility]    Culture   Final    CULTURE WILL BE EXAMINED FOR 6 WEEKS BEFORE ISSUING A FINAL REPORT Performed at [facility]    Report Status PENDING  Incomplete    Radiology Reports Dg Chest Port 1 View  09/08/2014   CLINICAL DATA:  Headache.  No chest complaints.  Brain lesion.  HIV.  EXAM: PORTABLE CHEST - 1 VIEW  COMPARISON:  None.  FINDINGS: The heart size and mediastinal contours are within normal limits. Both lungs are clear. The visualized skeletal structures are unremarkable.  IMPRESSION: No active disease.   Electronically Signed   By: Norva PavlovElizabeth  Brown M.D.   On: 09/08/2014 08:51   Dg Fluoro Guide Lumbar Puncture  09/09/2014   CLINICAL DATA:  Abnormal brain MRI.  EXAM: DIAGNOSTIC LUMBAR PUNCTURE UNDER FLUOROSCOPIC GUIDANCE  FLUOROSCOPY TIME:  Radiation Exposure Index (as provided by the fluoroscopic device): 9.2 mGy entrance dose  If the device does not provide the exposure index:  Fluoroscopy Time (in minutes and seconds):  0.8 minutes  Number of Acquired Images:  1  PROCEDURE:  Informed consent was obtained from the patient prior to the procedure, including potential complications of headache, allergy, and pain. With the patient left lateral decubitus, the lower back was prepped with Betadine. 1% Lidocaine was used for local anesthesia. Lumbar puncture was performed at the L3-4 level using a 22 gauge needle with return of clear CSF with an opening pressure of 33 cm  water. Large volume tap was requested. Despite the opening pressure, flow was intermittent due to tenuous needle position, and approximately 25 ml of CSF were obtained for laboratory studies. The patient tolerated the procedure well and there were no apparent complications.  IMPRESSION: 1. Successful lumbar puncture at the L3-4 level. 2. Opening pressure 33 cc water.   Electronically Signed   By: Marnee Spring M.D.   On: 09/09/2014 07:53    CBC  Recent Labs Lab 09/06/14 0500 09/06/14 0850 09/07/14 0420 09/08/14 0651  WBC 3.2* 2.7* 3.7* 4.4  HGB 12.0* 14.1 12.9* 12.8*  HCT 35.7* 41.1 38.4* 37.4*  PLT 116* 146* 148* 148*  MCV 98.6 97.9 98.0 97.4  MCH 33.1 33.6 32.9 33.3  MCHC 33.6 34.3 33.6 34.2  RDW 12.1 12.2 12.2 12.3  LYMPHSABS 1.0  --   --   --   MONOABS 0.3  --   --   --   EOSABS 0.1  --   --   --   BASOSABS 0.0  --   --   --     Chemistries   Recent Labs Lab 09/06/14 0500 09/07/14 0420 09/08/14 0651  NA 135 138 135  K 3.9 4.0 4.7  CL 109 104 101  CO2 GLUCOSE 98 94 83  BUN CREATININE 0.64 0.64 0.73  CALCIUM 8.2* 8.8 9.3  AST 20 18  --   ALT 39 31  --   ALKPHOS 57 59  --   BILITOT 0.4 0.5  --    ------------------------------------------------------------------------------------------------------------------ estimated creatinine clearance is 143.5 mL/min (by C-G formula based on Cr of 0.73). ------------------------------------------------------------------------------------------------------------------ No results for input(s): HGBA1C in the last 72  hours. ------------------------------------------------------------------------------------------------------------------ No results for input(s): CHOL, HDL, LDLCALC, TRIG, CHOLHDL, LDLDIRECT in the last 72 hours. ------------------------------------------------------------------------------------------------------------------ No results for input(s): TSH, T4TOTAL, T3FREE, THYROIDAB in the last 72 hours.  Invalid input(s): FREET3 ------------------------------------------------------------------------------------------------------------------ No results for input(s): VITAMINB12, FOLATE, FERRITIN, TIBC, IRON, RETICCTPCT in the last 72 hours.  Coagulation profile No results for input(s): INR, PROTIME in the last 168 hours.  No results for input(s): DDIMER in the last 72 hours.  Cardiac Enzymes No results for input(s): CKMB, TROPONINI, MYOGLOBIN in the last 168 hours.  Invalid input(s): CK ------------------------------------------------------------------------------------------------------------------ Invalid input(s): POCBNP  Time Spent in minutes   15 minutes   Christiane Ha M.D on 09/09/2014 at 12:36 PM  [email or web address] - password [facility]  [facility]

## 2014-09-10 ENCOUNTER — Telehealth (HOSPITAL_BASED_OUTPATIENT_CLINIC_OR_DEPARTMENT_OTHER): Payer: Self-pay | Admitting: Emergency Medicine

## 2014-09-10 LAB — RPR, QUANT+TP ABS (REFLEX): T Pallidum Abs: POSITIVE — AB

## 2014-09-10 LAB — RPR: RPR Ser Ql: REACTIVE — AB

## 2014-09-10 MED ORDER — ZOLPIDEM TARTRATE 5 MG PO TABS
5.0000 mg | ORAL_TABLET | Freq: Every evening | ORAL | Status: DC | PRN
  Administered 2014-09-10: 5 mg via ORAL
  Filled 2014-09-10: qty 1

## 2014-09-10 NOTE — Progress Notes (Signed)
Patient Demographics  Darrell Ortiz, is a 34 y.o. male, DOB - [birth year], ZOX:[id number]  Admit date - 09/06/2014   Admitting Physician Eduard ClosArshad N Kakrakandy, MD  Outpatient Primary MD for the patient is No PCP Per Patient  LOS - 4   Chief Complaint  Patient presents with  . Headache      Admission history of present illness/brief narrative: 34 year old male who  has a past medical history of HIV disease; Headache; and Noncompliance with medication regimen. Patient today came to the ED with chief complaint of headache which has been going on for past 3 weeks. Patient was seen in the ED a week ago and was given migraine cocktail with good results. Patient says that he felt better for couple of days and then again had the same headache. He says that headache starts behind the right eye and also has a transient neck stiffness especially in the morning. He denies any fever,  blurred vision, no nausea vomiting or diarrhea. No chest pain In the ED CT scan of the head was done which showed focal white matter hypoattenuation at the right parietal lobe with mild loss of gray-white differentiation. Could be reflective of progressive multifocal leukodystrophy or underlying mass. MRI brain with and without contrast done and showing 10 x 12 mm peripheral enhancing lesions in the right periatrial lobe.  Assessment & Plan   Headache /Right parietal lobe lesion: S/p LP. Multiple studies pending. HA better controlled with toradol prn. Dr. Orvan Falconerampbell is ensuring all tests in progress. If not, may need another LP.  Results may take >1 week, does not have to stay that long.  HIV/AIDS CD4 count is 170 No ART for now  Patient reports 4/15 CD4 269, 4/14 446 Viral load 4/14 39K  Constipation: had results with miralax  Code Status: Full  Family Communication: mom and friend at  bedside  Disposition Plan:    Procedures  LP 2/13  Consults   Infectious disease  Medications  Scheduled Meds: . enoxaparin (LOVENOX) injection  40 mg Subcutaneous Q24H  . polyethylene glycol  17 g Oral Daily  . sodium chloride  3 mL Intravenous Q12H   Continuous Infusions:   PRN Meds:.sodium chloride, diphenhydrAMINE, ketorolac, morphine injection, ondansetron **OR** ondansetron (ZOFRAN) IV, sodium chloride, traMADol  DVT Prophylaxis  Lovenox -  Lab Results  Component Value Date   PLT 148* 09/08/2014    Antibiotics    Anti-infectives    None     Subjective:  Feels "great". Had bowel movement  Objective:   Filed Vitals:   09/09/14 2124 09/10/14 0152 09/10/14 0501 09/10/14 1415  BP: 143/84  136/75 152/93  Pulse: 76  63 92  Temp: 99.1 F (37.3 C) 99.5 F (37.5 C) 98.6 F (37 C) 97.5 F (36.4 C)  TempSrc: Oral  Oral Oral  Resp: 18  18 18   Height:      Weight:      SpO2: 97%  97% 100%    Wt Readings from Last 3 Encounters:  09/06/14 90.719 kg (200 lb)  08/29/14 90.266 kg (199 lb)  06/08/14 97.523 kg (215 lb)     Intake/Output Summary (Last 24 hours) at 09/10/14 1527 Last data filed at 09/10/14 1500  Gross per 24 hour  Intake   1083 ml  Output      0 ml  Net   1083 ml     Physical Exam  Gen:  Lights on, shades open! Patient talkative and brighter Supple Neck,No JVD, No cervical lymphadenopathy lungs, CTAB without WRR CV RRR,No Gallops,Rubs or new Murmurs abd s, nt, nd No Cyanosis, Clubbing or edema,   Data Review   Micro Results Recent Results (from the past 240 hour(s))  Fungus Culture with Smear     Status: None (Preliminary result)   Collection Time: 09/07/14  2:05 PM  Result Value Ref Range Status   Specimen Description CSF  Final   Special Requests NONE  Final   Fungal Smear   Final    NO YEAST OR FUNGAL ELEMENTS SEEN Performed at [facility]    Culture   Final    CULTURE IN PROGRESS FOR FOUR WEEKS Performed at  [facility]    Report Status PENDING  Incomplete  Gram stain     Status: None   Collection Time: 09/07/14  2:05 PM  Result Value Ref Range Status   Specimen Description CSF  Final   Special Requests NONE  Final   Gram Stain   Final    CYTOSPIN SLIDE WBC PRESENT, PREDOMINANTLY MONONUCLEAR NO ORGANISMS SEEN    Report Status 09/07/2014 FINAL  Final  CSF culture     Status: None (Preliminary result)   Collection Time: 09/07/14  2:05 PM  Result Value Ref Range Status   Specimen Description CSF  Final   Special Requests NONE  Final   Gram Stain   Final    CYTOSPIN SLIDE WBC PRESENT, PREDOMINANTLY MONONUCLEAR NO ORGANISMS SEEN Performed at [facility] Performed at [facility]    Culture   Final    NO GROWTH 2 DAYS Performed at [facility]    Report Status PENDING  Incomplete  AFB culture with smear     Status: None (Preliminary result)   Collection Time: 09/07/14  2:05 PM  Result Value Ref Range Status   Specimen Description CSF  Final   Special Requests NONE  Final   Acid Fast Smear   Final    NO ACID FAST BACILLI SEEN Performed at [facility]    Culture   Final    CULTURE WILL BE EXAMINED FOR 6 WEEKS BEFORE ISSUING A FINAL REPORT Performed at [facility]    Report Status PENDING  Incomplete    Radiology Reports No results found.  CBC  Recent Labs Lab 09/06/14 0500 09/06/14 0850 09/07/14 0420 09/08/14 0651  WBC 3.2* 2.7* 3.7* 4.4  HGB 12.0* 14.1 12.9* 12.8*  HCT 35.7* 41.1 38.4* 37.4*  PLT 116* 146* 148* 148*  MCV 98.6 97.9 98.0 97.4  MCH 33.1 33.6 32.9 33.3  MCHC 33.6 34.3 33.6 34.2  RDW 12.1 12.2 12.2 12.3  LYMPHSABS 1.0  --   --   --   MONOABS 0.3  --   --   --   EOSABS 0.1  --   --   --   BASOSABS 0.0  --   --   --     Chemistries   Recent Labs Lab 09/06/14 0500 09/07/14 0420 09/08/14 0651  NA 135 138 135  K 3.9 4.0 4.7  CL 109 104 101  CO2 GLUCOSE 98 94 83  BUN CREATININE 0.64 0.64 0.73  CALCIUM 8.2* 8.8 9.3  AST 20 18  --   ALT 39 31  --   ALKPHOS 57 59  --   BILITOT 0.4 0.5  --    ------------------------------------------------------------------------------------------------------------------ estimated creatinine clearance is 143.5 mL/min (by C-G formula based on Cr of 0.73). ------------------------------------------------------------------------------------------------------------------ No results for input(s): HGBA1C in the last 72 hours. ------------------------------------------------------------------------------------------------------------------ No results for input(s): CHOL, HDL, LDLCALC, TRIG, CHOLHDL, LDLDIRECT in the last 72 hours. ------------------------------------------------------------------------------------------------------------------ No results for input(s): TSH, T4TOTAL, T3FREE, THYROIDAB in the last 72 hours.  Invalid input(s): FREET3 ------------------------------------------------------------------------------------------------------------------ No results for input(s): VITAMINB12, FOLATE, FERRITIN, TIBC, IRON, RETICCTPCT in the last 72 hours.  Coagulation profile No results for input(s): INR, PROTIME in the last 168 hours.  No results for input(s): DDIMER in the last 72 hours.  Cardiac Enzymes No results for input(s): CKMB, TROPONINI, MYOGLOBIN in the last 168 hours.  Invalid input(s): CK ------------------------------------------------------------------------------------------------------------------ Invalid input(s): POCBNP  Time Spent in minutes   15 minutes   Christiane Ha M.D on 09/10/2014 at 3:27 PM  [email or web address] - password [email or web address]  [facility]

## 2014-09-10 NOTE — Progress Notes (Signed)
Patient ID: Darrell Ortiz, male   DOB: 09-09-1980, 34 y.o.   MRN: [id number]         [facility]    Date of Admission:  09/06/2014     Principal Problem:   Right parietal lobe lesion Active Problems:   Headache   HIV disease   Cephalalgia   AIDS   Noncompliance   Aseptic meningitis   Constipation   . enoxaparin (LOVENOX) injection  40 mg Subcutaneous Q24H  . polyethylene glycol  17 g Oral Daily  . sodium chloride  3 mL Intravenous Q12H    Subjective: He feels well and is currently not having any problem with headache. He does not recall any previous CD4 counts were HIV viral loads. He has been followed by Dr. Fransico Michael at a clinic in [place], [place]. He lives in [place], [place]. He does not have any good reason why he never took Stribild consistently. He did not have any trouble tolerating it and was able to get it for free under the [place] ADAP program. He has not taken any Stribild and 11 months. He has never had any previous complications of HIV infection that he is aware of. He was treated for syphilis one year ago. 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  Review of Systems: Pertinent items are noted in HPI.  Past Medical History  Diagnosis Date  . HIV disease dx'd 06/2011  . Noncompliance with medication regimen   . Hearing disorder of right ear   . GERD (gastroesophageal reflux disease)   . Headache     "just lately" (09/09/2014)    History  Substance Use Topics  . Smoking status: Current Every Day Smoker -- 1.00 packs/day for 22 years    Types: Cigarettes  . Smokeless tobacco: Current User    Types: Snuff  . Alcohol Use: Yes     Comment: 09/09/2014 "maybe 3 times/year"    Family History  Problem Relation Age of Onset  . Cancer Other   . Diabetes Other    No Known Allergies  OBJECTIVE: Blood pressure 136/75, pulse 63, temperature 98.6 F (37 C), temperature source Oral, resp. rate 18, height  (1.753 m), weight  200 lb (90.719 kg), SpO2 97 %. General: He is alert, comfortable and in no distress watching television Skin: No rash Neck: Supple Lungs: Clear Cor: Regular S1 and S2 with no murmur Abdomen: Soft and nontender Neuro: No focal abnormalities. Normal speech and conversation  Lab Results Lab Results  Component Value Date   WBC 4.4 09/08/2014   HGB 12.8* 09/08/2014   HCT 37.4* 09/08/2014   MCV 97.4 09/08/2014   PLT 148* 09/08/2014    Lab Results  Component Value Date   CREATININE 0.73 09/08/2014   BUN 7 09/08/2014   NA 135 09/08/2014   K 4.7 09/08/2014   CL 101 09/08/2014   CO2 26 09/08/2014    Lab Results  Component Value Date   ALT 31 09/07/2014   AST 18 09/07/2014   ALKPHOS 59 09/07/2014   BILITOT 0.5 09/07/2014   CD4 T CELL ABS (/uL)  Date Value  09/06/2014 170*   CSF 09/07/2014: 408 white blood cells with 89% lymphocytes; protein 165; glucose 35; cryptococcal antigen negative; abundant monocytes on cytologic review. CSF studies for JC virus, Epstein-Barr virus, toxoplasmosis, histoplasmosis, blastomycosis, and Mycobacterium tuberculosis are pending.   Microbiology: Recent Results (from the past 240 hour(s))  Fungus Culture with Smear     Status: None (Preliminary  result)   Collection Time: 09/07/14  2:05 PM  Result Value Ref Range Status   Specimen Description CSF  Final   Special Requests NONE  Final   Fungal Smear   Final    NO YEAST OR FUNGAL ELEMENTS SEEN [facility]formed [facility]    Culture   Final    CULTURE IN PROGRESS FOR FOUR WEEKS Performed at [facility]    Report Status PENDING  Incomplete  Gram stain     Status: None   Collection Time: 09/07/14  2:05 PM  Result Value Ref Range Status   Specimen Description CSF  Final   Special Requests NONE  Final   Gram Stain   Final    CYTOSPIN SLIDE WBC PRESENT, PREDOMINANTLY MONONUCLEAR NO ORGANISMS SEEN    Report Status 09/07/2014 FINAL  Final  CSF culture     Status: None  (Preliminary result)   Collection Time: 09/07/14  2:05 PM  Result Value Ref Range Status   Specimen Description CSF  Final   Special Requests NONE  Final   Gram Stain   Final    CYTOSPIN SLIDE WBC PRESENT, PREDOMINANTLY MONONUCLEAR NO ORGANISMS SEEN [facility]    Culture   Final    NO GROWTH 2 DAYS Performed at [facility]    Report Status PENDING  Incomplete  AFB culture with smear     Status: None (Preliminary result)   Collection Time: 09/07/14  2:05 PM  Result Value Ref Range Status   Specimen Description CSF  Final   Special Requests NONE  Final   Acid Fast Smear   Final    NO ACID FAST BACILLI SEEN Performed at [facility]    Culture   Final    CULTURE WILL BE EXAMINED FOR 6 WEEKS BEFORE ISSUING A FINAL REPORT Performed at [facility]    Report Status PENDING  Incomplete    Assessment: MRI brain scan reveals a 10 x 12 mm, enhancing right parietal lesion that probably explains his recent headaches. This is associated with a lymphocytic meningitis. The differential diagnosis list is quite broad. I will check with our lab and determine if sufficient CSF was available for pending test and that all have been sent out. If there is insufficient sample he may need repeat, high-volume lumbar puncture. All of these tests are send outs and will take some time to return. It is possible that he could return home and resume work pending the results.  I will order an HIV viral load and genotype resistance assay. Be best to keep him off of antiretroviral therapy now pending diagnostic evaluation of his CNS lesion.  Plan: 1. Observe off of antiretroviral therapy and antibiotic therapy. 2. I will review CSF studies with the laboratory and follow-up tomorrow.  Cliffton AstersJohn Uchechi Denison, MD [facility] [phone] pager   [phone] cell 09/10/2014, 2:02 PM

## 2014-09-11 LAB — EPSTEIN BARR VRS(EBV DNA BY PCR)
EBV DNA QN by PCR: <200 copies/mL
EBV DNA QN by PCR: <200 copies/mL

## 2014-09-11 LAB — CSF CULTURE

## 2014-09-11 LAB — CSF CULTURE W GRAM STAIN: Culture: NO GROWTH

## 2014-09-11 LAB — HIV-1 RNA ULTRAQUANT REFLEX TO GENTYP+
HIV 1 RNA Quant: 22741 copies/mL — ABNORMAL HIGH (ref <20)
HIV-1 RNA Quant, Log: 4.36 {Log} — ABNORMAL HIGH (ref <1.30)

## 2014-09-11 MED ORDER — SULFAMETHOXAZOLE-TRIMETHOPRIM 800-160 MG PO TABS: 1.0000 | ORAL_TABLET | ORAL | Status: AC

## 2014-09-11 MED ORDER — NAPROXEN 250 MG PO TABS: 250.0000 mg | ORAL_TABLET | Freq: Two times a day (BID) | ORAL | Status: AC | PRN

## 2014-09-11 MED ORDER — ZOLPIDEM TARTRATE 5 MG PO TABS: 5.0000 mg | ORAL_TABLET | Freq: Every evening | ORAL | Status: AC | PRN

## 2014-09-11 NOTE — Discharge Summary (Signed)
Physician Discharge Summary  Darrell Ortiz:[id number] DOB: 04/20/1981 DOA: 09/06/2014  PCP: No PCP Per Patient  Admit date: 09/06/2014 Discharge date: 09/11/2014  Time spent: greater than 30 minutes  Recommendations for Outpatient Follow-up:  1. ID to follow up pending CSF studies. If no diagnosis found, would need brain biopsy for diagnosis  Discharge Diagnoses:  Principal Problem:   Right parietal lobe lesion Active Problems:   Cephalalgia   AIDS   Constipation  Discharge Condition: stab;e  Filed Weights   09/06/14 0324 09/06/14 0701  Weight: 88.451 kg (195 lb) 90.719 kg (200 lb)    History of present illness:  34 year old male who  has a past medical history of HIV disease; Headache; and Noncompliance with medication regimen. Patient today came to the ED with chief complaint of headache which has been going on for past 3 weeks. Patient was seen in the ED a week ago and was given migraine cocktail with good results. Patient says that he felt better for couple of days and then again had the same headache. He says that headache starts behind the right eye and also has a transient neck stiffness especially in the morning. He denies any fever, but felt sweaty after he takes aspirin or Goody powders. He denies any blurred vision, no nausea vomiting or diarrhea. No chest pain In the ED CT scan of the head was done which showed focal white matter hypoattenuation at the right parietal lobe with mild loss of gray-white differentiation. Could be reflective of progressive multifocal leukodystrophy or underlying mass. MRI brain with and without contrast recommended.  Hospital Course:  Transferred to [facility] hospital from [facility] ED.  ID consulted. MRI confirmed parietal lesion, "10 x 12 mm peripheral enhancing lesion in the right parietal lobe. It is difficult determine if this is intra-axial or extra-axial however it may have an element of both. Differential diagnosis includes  neoplasm such as lymphoma and Kaposi sarcoma. Metastatic disease and glioma considered less likely. Infection also considered. This is not a typical appearance for toxoplasmosis. Other fungal and tuberculosis infections are considerations."  LP attempted at bedside, then done under fluoro.  Opening pressure 33 cc H2O.  CSF glucose 35, protein 164, 400 WBC, 89% lymphocytes, 11% monocytes. Bacterial culture negative. AFB, fungal culture negative. Multiple CSF studies ordered by ID still pending. Headache better controlled on NSAIDS.  CD4 count 170.  ID recommends discharge, will f/u studies in [facility] as outpatient. If no diagnosis found will need brain biopsy.  Defer antiretrovirals for now.  Start PCP prophylaxis  Procedures:  LP  Consultations:  ID  IR  Discharge Exam: Filed Vitals:   09/11/14 0457  BP: 143/90  Pulse: 91  Temp: 99.7 F (37.6 C)  Resp: 16    General: comfortable, talkative Neck: supple, no LA Cardiovascular: RRR Respiratory: CTA abd s, nt, nd No CCE  Discharge Instructions   Discharge Instructions    Diet general    Complete by:  As directed      Increase activity slowly    Complete by:  As directed           Current Discharge Medication List    START taking these medications   Details  naproxen (NAPROSYN) 250 MG tablet Take 1 tablet (250 mg total) by mouth 2 (two) times daily as needed for mild pain. Qty: 60 tablet, Refills: 0    sulfamethoxazole-trimethoprim (BACTRIM DS,SEPTRA DS) 800-160 MG per tablet Take 1 tablet by mouth 3 (three) times a week.  Qty: 15 tablet, Refills: 0    zolpidem (AMBIEN) 5 MG tablet Take 1 tablet (5 mg total) by mouth at bedtime as needed for sleep. Qty: 20 tablet, Refills: 0      CONTINUE these medications which have NOT CHANGED   Details  Aspirin-Acetaminophen-Caffeine (GOODY HEADACHE PO) Take 1 Package by mouth every 4 (four) hours as needed (pain, and headache).    phenylephrine (SUDAFED PE) 10 MG TABS tablet Take  10-20 mg by mouth every 4 (four) hours as needed (for congestion).      STOP taking these medications     ibuprofen (ADVIL,MOTRIN) 200 MG tablet      oxyCODONE-acetaminophen (PERCOCET/ROXICET) 5-325 MG per tablet        No Known Allergies Follow-up Information    Follow up On 09/24/2014.       The results of significant diagnostics from this hospitalization (including imaging, microbiology, ancillary and laboratory) are listed below for reference.    Significant Diagnostic Studies: Ct Head Wo Contrast  09/06/2014   CLINICAL DATA:  Subacute onset of headache for 2 weeks. Current history of HIV. Initial encounter.  EXAM: CT HEAD WITHOUT CONTRAST  TECHNIQUE: Contiguous axial images were obtained from the base of the skull through the vertex without intravenous contrast.  COMPARISON:  CT of the head performed 06/08/2014  FINDINGS: There is no evidence of intra- or extra-axial hemorrhage on CT.  Focal decreased white matter attenuation is noted at the right parietal lobe, with mild loss of gray-white differentiation. Given the history of HIV, this could reflect progressive multifocal leukodystrophy, or possibly an underlying mass. A small abscess could conceivably have a similar appearance, if the patient has corresponding symptoms.  The posterior fossa, including the cerebellum, brainstem and fourth ventricle, is within normal limits. The third and lateral ventricles, and basal ganglia are unremarkable in appearance. No midline shift is seen.  There is no evidence of fracture; visualized osseous structures are unremarkable in appearance. The visualized portions of the orbits are within normal limits. The paranasal sinuses and mastoid air cells are well-aerated. No significant soft tissue abnormalities are seen.  IMPRESSION: Focal white matter hypoattenuation at the right parietal lobe, with mild loss of gray-white differentiation. Given the history of HIV, this could reflect progressive multifocal  leukodystrophy, or possibly an underlying mass. A small abscess could conceivably have a similar appearance, if the patient has corresponding symptoms.  MRI of the brain with and without contrast is recommended for further evaluation; would also correlate with the patient's CD4 count.  These results were called by telephone at the time of interpretation on 09/06/2014 at 4:30 am to Dr. Geoffery LyonsUGLAS DELO, who verbally acknowledged these results.   Electronically Signed   By: Roanna RaiderJeffery  Chang M.D.   On: 09/06/2014 04:30   Mr Laqueta JeanBrain W ZOWo Contrast  09/06/2014   CLINICAL DATA:  HIV.  Headache.  Abnormal CT.  EXAM: MRI HEAD WITHOUT AND WITH CONTRAST  TECHNIQUE: Multiplanar, multiecho pulse sequences of the brain and surrounding structures were obtained without and with intravenous contrast.  CONTRAST:  19mL MULTIHANCE GADOBENATE DIMEGLUMINE 529 MG/ML IV SOLN  COMPARISON:  CT head 09/06/2014, 06/08/2014  FINDINGS: Moderate edema in the right parietal white matter as noted on CT. 10 x 12 mm enhancing lesion in the right parietal lobe. This is peripherally located and lobular in shape and shows homogeneous enhancement without necrosis. It is difficult to determine if this is a peripheral intra-axial lesion versus a dural based lesion. No dural cleft is  seen and this may represent an intra-axial lesion. There is slight enhancement of the adjacent dura. This lesion does not show restricted diffusion or hemorrhage. No other enhancing lesions are seen.  The remainder of the brain is normal. Ventricle size is normal. No shift of the midline structures. Negative for acute or chronic infarction.  Calvarium is intact. No bony lesion is identified on the recent CT in the right parietal bone.  Paranasal sinuses are clear.  IMPRESSION: 10 x 12 mm peripheral enhancing lesion in the right parietal lobe. It is difficult determine if this is intra-axial or extra-axial however it may have an element of both. Differential diagnosis includes  neoplasm such as lymphoma and Kaposi sarcoma. Metastatic disease and glioma considered less likely. Infection also considered. This is not a typical appearance for toxoplasmosis. Other fungal and tuberculosis infections are considerations.   Electronically Signed   By: Marlan Palau M.D.   On: 09/06/2014 09:38   Dg Chest Port 1 View  09/08/2014   CLINICAL DATA:  Headache.  No chest complaints.  Brain lesion.  HIV.  EXAM: PORTABLE CHEST - 1 VIEW  COMPARISON:  None.  FINDINGS: The heart size and mediastinal contours are within normal limits. Both lungs are clear. The visualized skeletal structures are unremarkable.  IMPRESSION: No active disease.   Electronically Signed   By: Norva Pavlov M.D.   On: 09/08/2014 08:51   Dg Fluoro Guide Lumbar Puncture  09/09/2014   CLINICAL DATA:  Abnormal brain MRI.  EXAM: DIAGNOSTIC LUMBAR PUNCTURE UNDER FLUOROSCOPIC GUIDANCE  FLUOROSCOPY TIME:  Radiation Exposure Index (as provided by the fluoroscopic device): 9.2 mGy entrance dose  If the device does not provide the exposure index:  Fluoroscopy Time (in minutes and seconds):  0.8 minutes  Number of Acquired Images:  1  PROCEDURE: Informed consent was obtained from the patient prior to the procedure, including potential complications of headache, allergy, and pain. With the patient left lateral decubitus, the lower back was prepped with Betadine. 1% Lidocaine was used for local anesthesia. Lumbar puncture was performed at the L3-4 level using a 22 gauge needle with return of clear CSF with an opening pressure of 33 cm water. Large volume tap was requested. Despite the opening pressure, flow was intermittent due to tenuous needle position, and approximately 25 ml of CSF were obtained for laboratory studies. The patient tolerated the procedure well and there were no apparent complications.  IMPRESSION: 1. Successful lumbar puncture at the L3-4 level. 2. Opening pressure 33 cc water.   Electronically Signed   By: Marnee Spring M.D.   On: 09/09/2014 07:53    Microbiology: Recent Results (from the past 240 hour(s))  Fungus Culture with Smear     Status: None (Preliminary result)   Collection Time: 09/07/14  2:05 PM  Result Value Ref Range Status   Specimen Description CSF  Final   Special Requests NONE  Final   Fungal Smear   Final    NO YEAST OR FUNGAL ELEMENTS SEEN Performed at [facility]    Culture   Final    CULTURE IN PROGRESS FOR FOUR WEEKS Performed at [facility]    Report Status PENDING  Incomplete  Gram stain     Status: None   Collection Time: 09/07/14  2:05 PM  Result Value Ref Range Status   Specimen Description CSF  Final   Special Requests NONE  Final   Gram Stain   Final    CYTOSPIN SLIDE  WBC PRESENT, PREDOMINANTLY MONONUCLEAR NO ORGANISMS SEEN    Report Status 09/07/2014 FINAL  Final  CSF culture     Status: None (Preliminary result)   Collection Time: 09/07/14  2:05 PM  Result Value Ref Range Status   Specimen Description CSF  Final   Special Requests NONE  Final   Gram Stain   Final    CYTOSPIN SLIDE WBC PRESENT, PREDOMINANTLY MONONUCLEAR NO ORGANISMS SEEN Performed at [facility] Performed at [facility]    Culture   Final    NO GROWTH 2 DAYS Performed at [facility]    Report Status PENDING  Incomplete  AFB culture with smear     Status: None (Preliminary result)   Collection Time: 09/07/14  2:05 PM  Result Value Ref Range Status   Specimen Description CSF  Final   Special Requests NONE  Final   Acid Fast Smear   Final    NO ACID FAST BACILLI SEEN Performed at [facility]    Culture   Final    CULTURE WILL BE EXAMINED FOR 6 WEEKS BEFORE ISSUING A FINAL REPORT Performed at [facility]    Report Status PENDING  Incomplete     Labs: Basic Metabolic Panel:  Recent Labs Lab 09/06/14 0500 09/07/14 0420 09/08/14 0651  NA 135 138 135  K 3.9 4.0 4.7  CL 109 104 101  CO2 GLUCOSE  98 94 83  BUN CREATININE 0.64 0.64 0.73  CALCIUM 8.2* 8.8 9.3   Liver Function Tests:  Recent Labs Lab 09/06/14 0500 09/07/14 0420  AST 20 18  ALT 39 31  ALKPHOS 57 59  BILITOT 0.4 0.5  PROT 6.5 6.1  ALBUMIN 3.2* 3.2*   No results for input(s): LIPASE, AMYLASE in the last 168 hours. No results for input(s): AMMONIA in the last 168 hours. CBC:  Recent Labs Lab 09/06/14 0500 09/06/14 0850 09/07/14 0420 09/08/14 0651  WBC 3.2* 2.7* 3.7* 4.4  NEUTROABS 1.8  --   --   --   HGB 12.0* 14.1 12.9* 12.8*  HCT 35.7* 41.1 38.4* 37.4*  MCV 98.6 97.9 98.0 97.4  PLT 116* 146* 148* 148*   Cardiac Enzymes: No results for input(s): CKTOTAL, CKMB, CKMBINDEX, TROPONINI in the last 168 hours. BNP: BNP (last 3 results) No results for input(s): BNP in the last 8760 hours.  ProBNP (last 3 results) No results for input(s): PROBNP in the last 8760 hours.  CBG: No results for input(s): GLUCAP in the last 168 hours.     SignedChristiane Ha  [facility] 09/11/2014, 10:24 AM

## 2014-09-11 NOTE — Progress Notes (Signed)
To whom it may concern:   Darrell Ortiz is unable to work from 09/06/14 through 2/17 due to illness.    Sincerely,      Crista Curborinna Valjean Ruppel, MD [facility]

## 2014-09-11 NOTE — Progress Notes (Signed)
RPR +. Dr. Lendell CapriceSullivan notified. Discharged home accompanied by friend.

## 2014-09-11 NOTE — Progress Notes (Signed)
Patient ID: Darrell Ortiz, male   DOB: Nov 29, 1980, 34 y.o.   MRN: [id number]         [facility]    Date of Admission:  09/06/2014     Principal Problem:   Right parietal lobe lesion Active Problems:   Headache   HIV disease   Cephalalgia   AIDS   Noncompliance   Aseptic meningitis   Constipation   . enoxaparin (LOVENOX) injection  40 mg Subcutaneous Q24H  . polyethylene glycol  17 g Oral Daily  . sodium chloride  3 mL Intravenous Q12H    Subjective: He is feeling well. His headache is controlled with Toradol.  Review of Systems: Pertinent items are noted in HPI.  Past Medical History  Diagnosis Date  . HIV disease dx'd 06/2011  . Noncompliance with medication regimen   . Hearing disorder of right ear   . GERD (gastroesophageal reflux disease)   . Headache     "just lately" (09/09/2014)    History  Substance Use Topics  . Smoking status: Current Every Day Smoker -- 1.00 packs/day for 22 years    Types: Cigarettes  . Smokeless tobacco: Current User    Types: Snuff  . Alcohol Use: Yes     Comment: 09/09/2014 "maybe 3 times/year"    Family History  Problem Relation Age of Onset  . Cancer Other   . Diabetes Other    No Known Allergies  OBJECTIVE: Blood pressure 143/90, pulse 91, temperature 99.7 F (37.6 C), temperature source Oral, resp. rate 16, height  (1.753 m), weight 200 lb (90.719 kg), SpO2 98 %. General: He is dressed and sitting up in a chair Skin: No rash Neck: Supple Lungs: Clear Cor: Regular S1 and S2 with no murmur Abdomen: Soft and nontender Neuro: No focal abnormalities. Normal speech and conversation  Lab Results Lab Results  Component Value Date   WBC 4.4 09/08/2014   HGB 12.8* 09/08/2014   HCT 37.4* 09/08/2014   MCV 97.4 09/08/2014   PLT 148* 09/08/2014    Lab Results  Component Value Date   CREATININE 0.73 09/08/2014   BUN 7 09/08/2014   NA 135 09/08/2014   K 4.7 09/08/2014   CL 101  09/08/2014   CO2 26 09/08/2014    Lab Results  Component Value Date   ALT 31 09/07/2014   AST 18 09/07/2014   ALKPHOS 59 09/07/2014   BILITOT 0.5 09/07/2014   CD4 T CELL ABS (/uL)  Date Value  09/06/2014 170*   CSF 09/07/2014: 408 white blood cells with 89% lymphocytes; protein 165; glucose 35; cryptococcal antigen negative; abundant monocytes on cytologic review.  CSF cultures remain negative CSF studies for JC virus, Epstein-Barr virus, toxoplasmosis, histoplasmosis, blastomycosis, and Mycobacterium tuberculosis are pending. I spoke with the lab this morning and CSF for the studies was sent out yesterday to 2 different labs in [place] ([address]). Results should return no sooner than 5 days and no later than 12 days.  Microbiology: Recent Results (from the past 240 hour(s))  Fungus Culture with Smear     Status: None (Preliminary result)   Collection Time: 09/07/14  2:05 PM  Result Value Ref Range Status   Specimen Description CSF  Final   Special Requests NONE  Final   Fungal Smear   Final    NO YEAST OR FUNGAL ELEMENTS SEEN Performed at [facility]    Culture   Final    CULTURE IN PROGRESS  FOR FOUR WEEKS [facility]    Report Status PENDING  Incomplete  Gram stain     Status: None   Collection Time: 09/07/14  2:05 PM  Result Value Ref Range Status   Specimen Description CSF  Final   Special Requests NONE  Final   Gram Stain   Final    CYTOSPIN SLIDE WBC PRESENT, PREDOMINANTLY MONONUCLEAR NO ORGANISMS SEEN    Report Status 09/07/2014 FINAL  Final  CSF culture     Status: None (Preliminary result)   Collection Time: 09/07/14  2:05 PM  Result Value Ref Range Status   Specimen Description CSF  Final   Special Requests NONE  Final   Gram Stain   Final    CYTOSPIN SLIDE WBC PRESENT, PREDOMINANTLY MONONUCLEAR NO ORGANISMS SEEN [facility]    Culture   Final    NO  GROWTH 2 DAYS [facility]formed at [facility]    Report Status PENDING  Incomplete  AFB culture with smear     Status: None (Preliminary result)   Collection Time: 09/07/14  2:05 PM  Result Value Ref Range Status   Specimen Description CSF  Final   Special Requests NONE  Final   Acid Fast Smear   Final    NO ACID FAST BACILLI SEEN [facility] at [facility]    Culture   Final    CULTURE WILL BE EXAMINED FOR 6 WEEKS BEFORE ISSUING A FINAL REPORT [facility]    Report Status PENDING  Incomplete    Assessment: He is clinically stable and I favor sending him home today and have him follow-up with me in clinic in 2 weeks once results of CSF send out tests,HIV viral load and genotype resistance assay results are available. He is in agreement with that plan. Since her CD4 count is now down to 170 he needs to start pneumocystis prophylaxis with trimethoprim sulfamethoxazole.  Plan: 1. Observe off of antiretroviral therapy for now. 2. Start trimethoprim sulfamethoxazole one double strength tablet daily 3. Naprosyn as needed for headaches 4. I will arrange follow-up with me in clinic on 09/24/2014  Cliffton AstersJohn Rosela Supak, MD [facility] [phone] pager   [phone] 09/11/2014, 8:53 AM

## 2014-09-12 LAB — JC VIRUS, PCR CSF: JC VIRUS PCR (CSF): NOT DETECTED

## 2014-09-12 LAB — TOXOPLASMA GONDII, PCR: Toxoplasma Gondii, PCR: NOT DETECTED

## 2014-09-12 LAB — VDRL, CSF: VDRL Quant, CSF: 1:2 {titer}

## 2014-09-13 ENCOUNTER — Telehealth (HOSPITAL_BASED_OUTPATIENT_CLINIC_OR_DEPARTMENT_OTHER): Payer: Self-pay | Admitting: Emergency Medicine

## 2014-09-16 ENCOUNTER — Telehealth: Payer: Self-pay | Admitting: Licensed Clinical Social Worker

## 2014-09-16 ENCOUNTER — Other Ambulatory Visit: Payer: Self-pay | Admitting: Licensed Clinical Social Worker

## 2014-09-16 DIAGNOSIS — A523 Neurosyphilis, unspecified: Secondary | ICD-10-CM

## 2014-09-16 LAB — MISCELLANEOUS TEST

## 2014-09-16 LAB — HISTOPLASMA ANTIGEN (BLD, CSF, BRONCH WASH, OTHER)

## 2014-09-16 LAB — M. TUBERCULOSIS COMPLEX BY PCR: M TUBERCULOSISDIRECT: NOT DETECTED

## 2014-09-16 NOTE — Telephone Encounter (Signed)
Called patient to give results of his lumbar puncture(he may know, Im not sure) and tell him that he needs treatment with a Picc line. Appointment scheduled for 09/18/14 at 11:30 am. [facility]. Spoke with Gunnar Fusiaula in Radiology at [phone] and [facility] and [facility] would organize the first dose after the PICC placement. [place] [facility] [place] [phone].

## 2014-09-16 NOTE — Telephone Encounter (Signed)
I also faxed the short stay order form with the antibiotic first dose order to short stay at [phone]

## 2014-09-17 LAB — HIV-1 INTEGRASE GENOTYPE
Date Viral Load Collected: NO GROWTH
VALUE LAST VIRAL LOAD: NO GROWTH

## 2014-09-18 ENCOUNTER — Ambulatory Visit (HOSPITAL_COMMUNITY): Admission: RE | Admit: 2014-09-18 | Payer: BLUE CROSS/BLUE SHIELD | Source: Ambulatory Visit

## 2014-09-19 LAB — HISTOPLASMA ANTIGEN, URINE: Histoplasma Antigen, urine: <0.5 ng/mL

## 2014-09-20 NOTE — Progress Notes (Signed)
Patient never showed up for the picc line insertion appointment that I spent all day working on. I called the patient several times and left messages, did not get a rsponse.

## 2014-09-26 ENCOUNTER — Ambulatory Visit (INDEPENDENT_AMBULATORY_CARE_PROVIDER_SITE_OTHER): Payer: BLUE CROSS/BLUE SHIELD | Admitting: Internal Medicine

## 2014-09-26 ENCOUNTER — Encounter: Payer: Self-pay | Admitting: *Deleted

## 2014-09-26 ENCOUNTER — Encounter: Payer: Self-pay | Admitting: Internal Medicine

## 2014-09-26 VITALS — BP 136/89 | HR 102 | Temp 97.7°F | Ht 69.0 in | Wt 209.5 lb

## 2014-09-26 DIAGNOSIS — Z720 Tobacco use: Secondary | ICD-10-CM | POA: Diagnosis not present

## 2014-09-26 DIAGNOSIS — Z23 Encounter for immunization: Secondary | ICD-10-CM | POA: Diagnosis not present

## 2014-09-26 DIAGNOSIS — F17210 Nicotine dependence, cigarettes, uncomplicated: Secondary | ICD-10-CM

## 2014-09-26 DIAGNOSIS — B20 Human immunodeficiency virus [HIV] disease: Secondary | ICD-10-CM

## 2014-09-26 DIAGNOSIS — A523 Neurosyphilis, unspecified: Secondary | ICD-10-CM

## 2014-09-26 MED ORDER — ELVITEG-COBIC-EMTRICIT-TENOFAF 150-150-200-10 MG PO TABS: 1.0000 | ORAL_TABLET | Freq: Every day | ORAL | Status: DC

## 2014-09-26 MED ORDER — CEFTRIAXONE SODIUM 1 G IV SOLR: 2.0000 g | INTRAVENOUS | Status: AC

## 2014-09-26 NOTE — Progress Notes (Signed)
Patient ID: Darrell Ortiz, male   DOB: [birth year], 34 y.o.   MRN: [id number]          Patient Active Problem List   Diagnosis Date Noted  . Cigarette smoker 09/26/2014  . Neurosyphilis   . HIV disease 09/06/2014  . Right parietal lobe lesion     Patient's Medications  New Prescriptions   CEFTRIAXONE (ROCEPHIN) 1 G SOLR INJECTION    Inject 2 g into the vein daily.   ELVITEGRAVIR-COBICISTAT-EMTRICITABINE-TENOFOVIR (GENVOYA) 150-150-200-10 MG TABS TABLET    Take 1 tablet by mouth daily with breakfast.  Previous Medications   NAPROXEN (NAPROSYN) 250 MG TABLET    Take 1 tablet (250 mg total) by mouth 2 (two) times daily as needed for mild pain.   SULFAMETHOXAZOLE-TRIMETHOPRIM (BACTRIM DS,SEPTRA DS) 800-160 MG PER TABLET    Take 1 tablet by mouth 3 (three) times a week.   ZOLPIDEM (AMBIEN) 5 MG TABLET    Take 1 tablet (5 mg total) by mouth at bedtime as needed for sleep.  Modified Medications   No medications on file  Discontinued Medications   ASPIRIN-ACETAMINOPHEN-CAFFEINE (GOODY HEADACHE PO)    Take 1 Package by mouth every 4 (four) hours as needed (pain, and headache).   PHENYLEPHRINE (SUDAFED PE) 10 MG TABS TABLET    Take 10-20 mg by mouth every 4 (four) hours as needed (for congestion).    Subjective: Darrell Ortiz is in for his hospital follow-up visit. He lives in [place] was diagnosed with HIV infection in 2012. He has never had any complications of his HIV infection that he is aware of. He does not recall previous CD4 counts or viral loads. He took Stribild for a very brief amount of time several years ago. He was able to get it free through the [place] ADAP program. He did not have any problems tolerating it but simply did not take it. He has not been on any medication for the last 2 years. He was treated for syphilis last year. He developed some headache and was seen at [facility] last month. Brain MRI revealed a 10 x 12 mm, enhancing right parietal lobe lesion.  He underwent lumbar puncture that showed 408 white blood cells with a predominance of lymphocytes. His protein was elevated at 165 and his glucose was low at 35. Routine, fungal and AFB stains and cultures are negative so far. His serum RPR was positive at a titer of 1:8 and his CSF VDRL was positive at a titer 1:2. Studies for EBV, JC virus, toxoplasma, histoplasma, cryptococcus, MTB, and cytology were all negative. He is no longer having any headache. He works as an Medical illustrator. He has a daily cigarette smoker.  Review of Systems: Constitutional: negative Eyes: negative Ears, nose, mouth, throat, and face: negative Respiratory: negative Cardiovascular: negative Gastrointestinal: negative Genitourinary:negative  Past Medical History  Diagnosis Date  . HIV disease dx'd 06/2011  . Noncompliance with medication regimen   . Hearing disorder of right ear   . GERD (gastroesophageal reflux disease)   . Headache     "just lately" (09/09/2014)    History  Substance Use Topics  . Smoking status: Current Every Day Smoker -- 1.50 packs/day for 22 years    Types: Cigarettes  . Smokeless tobacco: Current User    Types: Snuff  . Alcohol Use: 0.0 oz/week    0 Standard drinks or equivalent per week     Comment: 09/09/2014 "maybe 3 times/year"    Family History  Problem Relation Age of Onset  . Cancer Other   . Diabetes Other     No Known Allergies  Objective: Temp: 97.7 F (36.5 C) (03/03 1337) Temp Source: Oral (03/03 1337) BP: 136/89 mmHg (03/03 1337) Pulse Rate: 102 (03/03 1337) Body mass index is 30.92 kg/(m^2).  General: He is in no distress Oral: No oropharyngeal lesions Skin: No rash Lungs: Clear Cor: Regular S1 and S2 with no murmur Abdomen: Soft and nontender Joints and extremities: Normal Neuro: Alert with normal speech and conversation. No focal abnormalities Mood: Appropriate  Lab Results Lab Results  Component Value Date   WBC 4.4 09/08/2014   HGB  12.8* 09/08/2014   HCT 37.4* 09/08/2014   MCV 97.4 09/08/2014   PLT 148* 09/08/2014    Lab Results  Component Value Date   CREATININE 0.73 09/08/2014   BUN 7 09/08/2014   NA 135 09/08/2014   K 4.7 09/08/2014   CL 101 09/08/2014   CO2 26 09/08/2014    Lab Results  Component Value Date   ALT 31 09/07/2014   AST 18 09/07/2014   ALKPHOS 59 09/07/2014   BILITOT 0.5 09/07/2014    No results found for: CHOL, HDL, LDLCALC, LDLDIRECT, TRIG, CHOLHDL  Lab Results HIV 1 RNA QUANT (copies/mL)  Date Value  09/10/2014 22741*   CD4 T CELL ABS (/uL)  Date Value  09/06/2014 170*     Assessment: He has moderately advanced untreated HIV infection is now very motivated to get started back on therapy. His HIV strain has no resistance dictations. I will get him started on Genvoya. He is given voucher cards and will fill out papers to recertify for ADAP in [place] within the next week.  He has neurosyphilis. I will treat him with ceftriaxone 2 g IV daily for 10 days then monitor clinically, with serial RPRs and consider repeating brain MRI.  I will get repeat blood work on him in 6 weeks. He needs complete baseline workup including hepatitis serologies, interferon gamma release assay for latent tuberculosis and urine and GC screens.  I talked to him about the importance of considering cigarette cessation.  Plan: 1. Start Genvoya 2. PICC placement so he can start ceftriaxone 3. Follow-up in 6 weeks   Cliffton AstersJohn Tymon Nemetz, MD [facility] [phone] pager   [phone] cell 09/26/2014, 2:17 PM

## 2014-09-27 ENCOUNTER — Telehealth: Payer: Self-pay | Admitting: *Deleted

## 2014-09-27 NOTE — Telephone Encounter (Signed)
Patient called and advised that he was told by [facility] that his copay for the course of treatment is $330 that has to be paid upfront and he has no way to get the money. The patient wants to know if he can be given some other medication or is this his only option? Advised the patient how serious this Dx is and that if there is anyway he could come up with the money he should try. He advised he has no way to get the money and really wants to be treated. Advised the patient will ask the doctor and call him back.

## 2014-09-30 NOTE — Telephone Encounter (Addendum)
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

## 2014-09-30 NOTE — Telephone Encounter (Addendum)
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 The patient advised he will get the money from his mother as he had no idea this Dx was that serious.

## 2014-10-01 ENCOUNTER — Ambulatory Visit (HOSPITAL_COMMUNITY)
Admission: RE | Admit: 2014-10-01 | Discharge: 2014-10-01 | Disposition: A | Discharge status: Home / Self Care | Payer: BLUE CROSS/BLUE SHIELD | Source: Ambulatory Visit | Attending: Interventional Radiology | Admitting: Interventional Radiology

## 2014-10-01 ENCOUNTER — Other Ambulatory Visit: Payer: Self-pay | Admitting: Infectious Disease

## 2014-10-01 DIAGNOSIS — A523 Neurosyphilis, unspecified: Secondary | ICD-10-CM | POA: Insufficient documentation

## 2014-10-01 DIAGNOSIS — Z21 Asymptomatic human immunodeficiency virus [HIV] infection status: Secondary | ICD-10-CM | POA: Insufficient documentation

## 2014-10-01 MED ORDER — HEPARIN SOD (PORK) LOCK FLUSH 100 UNIT/ML IV SOLN
250.0000 [IU] | Freq: Once | INTRAVENOUS | Status: AC
  Administered 2014-10-01: 250 [IU] via INTRAVENOUS

## 2014-10-01 MED ORDER — LIDOCAINE HCL 1 % IJ SOLN
INTRAMUSCULAR | Status: AC
  Filled 2014-10-01: qty 20

## 2014-10-01 MED ORDER — CEFTRIAXONE SODIUM IN DEXTROSE 40 MG/ML IV SOLN
2.0000 g | Freq: Once | INTRAVENOUS | Status: AC
  Administered 2014-10-01: 2 g via INTRAVENOUS
  Filled 2014-10-01 (×2): qty 50

## 2014-10-01 NOTE — Progress Notes (Signed)
Flush single lumen pick with 10cc NS and followed by Hep lock as ordered.  No redness in the area and patient had no complaints of pain in the arm.  No signs of adverse effect or signs or symptoms of allergic reaction from any of the infusing.  Picc line flushed very well.

## 2014-10-01 NOTE — Procedures (Signed)
Procedure:  Right arm PICC line placement Findings:  38 cm DL Power PICC via right brachial vein to cavoatrial junction. Ready for use.  No complications. EBL: None

## 2014-10-04 ENCOUNTER — Other Ambulatory Visit: Payer: Self-pay | Admitting: Licensed Clinical Social Worker

## 2014-10-04 DIAGNOSIS — B20 Human immunodeficiency virus [HIV] disease: Secondary | ICD-10-CM

## 2014-10-04 LAB — FUNGUS CULTURE W SMEAR: Fungal Smear: NONE SEEN

## 2014-10-04 MED ORDER — ELVITEG-COBIC-EMTRICIT-TENOFAF 150-150-200-10 MG PO TABS: 1.0000 | ORAL_TABLET | Freq: Every day | ORAL | Status: AC

## 2014-10-14 ENCOUNTER — Telehealth: Payer: Self-pay | Admitting: *Deleted

## 2014-10-14 NOTE — Telephone Encounter (Signed)
Yes, pull PICC.

## 2014-10-14 NOTE — Telephone Encounter (Signed)
[facility] spoke w/ pt. He pulled his PICC 10/11/14.

## 2014-10-14 NOTE — Telephone Encounter (Signed)
Pt completed 10 of Rocephin on 10/11/14, OK to pull PICC?

## 2014-10-21 LAB — AFB CULTURE WITH SMEAR (NOT AT ARMC): Acid Fast Smear: NONE SEEN

## 2014-11-08 ENCOUNTER — Encounter: Payer: Self-pay | Admitting: Internal Medicine

## 2014-12-16 ENCOUNTER — Telehealth: Payer: Self-pay | Admitting: *Deleted

## 2014-12-16 NOTE — Telephone Encounter (Signed)
Patient called regarding his upcoming appt on 12/19/14. He stated in the voice mail that he did not want to come in when he has not had any recent labs. Left him a message to return the call.

## 2014-12-19 ENCOUNTER — Ambulatory Visit: Payer: BLUE CROSS/BLUE SHIELD | Admitting: Internal Medicine

## 2015-06-23 ENCOUNTER — Encounter: Payer: Self-pay | Admitting: *Deleted

## 2015-06-23 NOTE — Progress Notes (Signed)
Patient ID: Darrell BodoJonathan Ortiz, male   DOB: 1981/04/30, 34 y.o.   MRN: [id number] [facility] Counselor Referral per Dr. Orvan Falconerampbell.  Referral made and [facility].

## 2015-11-10 IMAGING — CT CT HEAD W/O CM
1 series · 15 of 30 positions shown, 19 images · non-contrast
Comparison: CT of the head performed 06/08/2014

CLINICAL DATA: Subacute onset of headache for 2 weeks. Current
history of HIV. Initial encounter.

EXAM:
CT HEAD WITHOUT CONTRAST
TECHNIQUE: Contiguous axial images were obtained from the base of the skull
through the vertex without intravenous contrast.

[Series 2: headtrauma 4.8 h37s · axial · 0.47mm/px · z∈[+57,+217]mm · 15 of 36 slices shown, 19 images]
[im 2/36  brain]
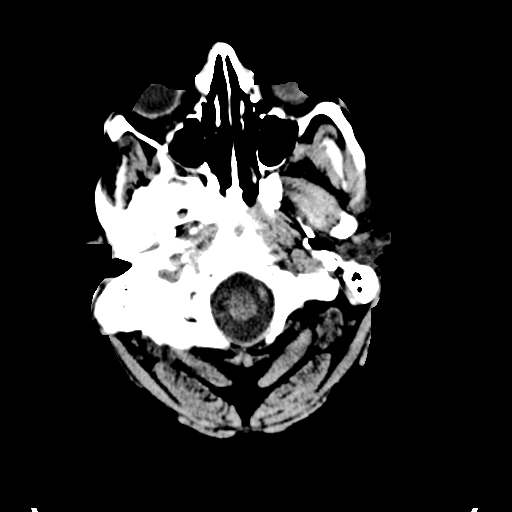
[im 2/36  bone]
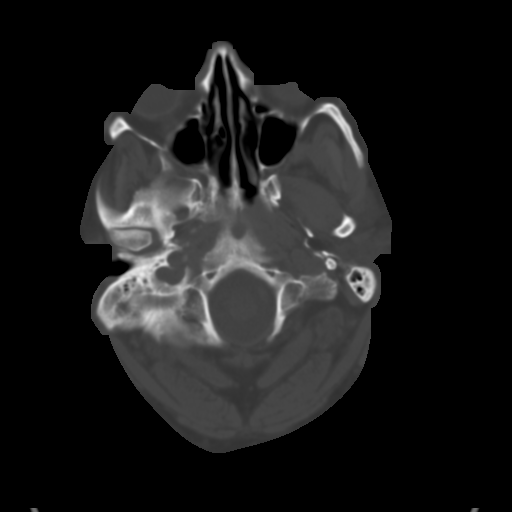
[im 4/36  brain]
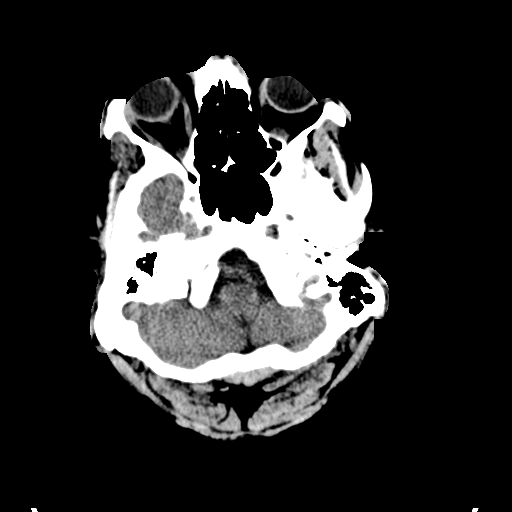
[im 7/36  brain]
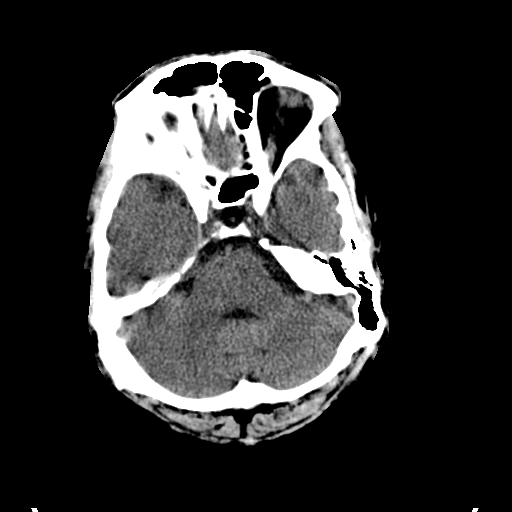
[im 9/36  brain]
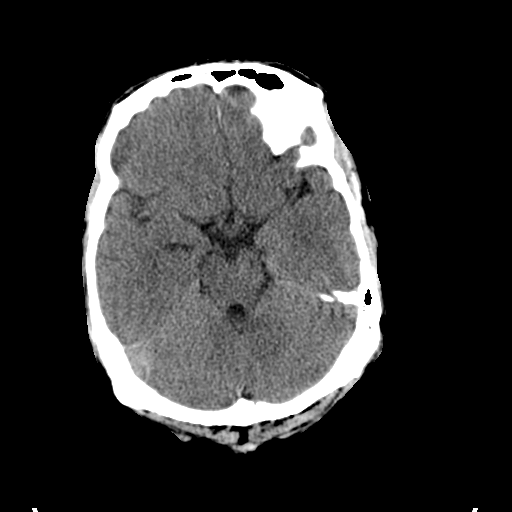
[im 11/36  brain]
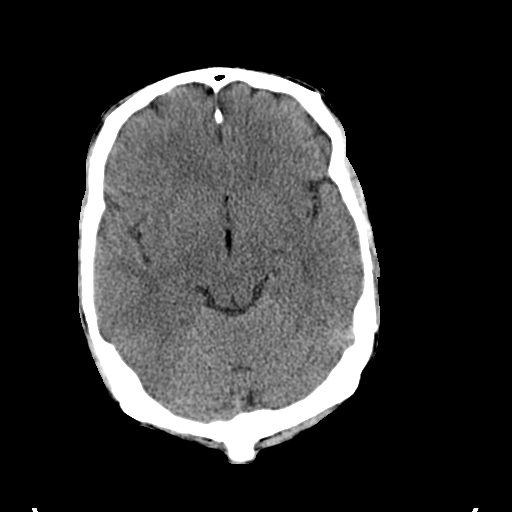
[im 11/36  bone]
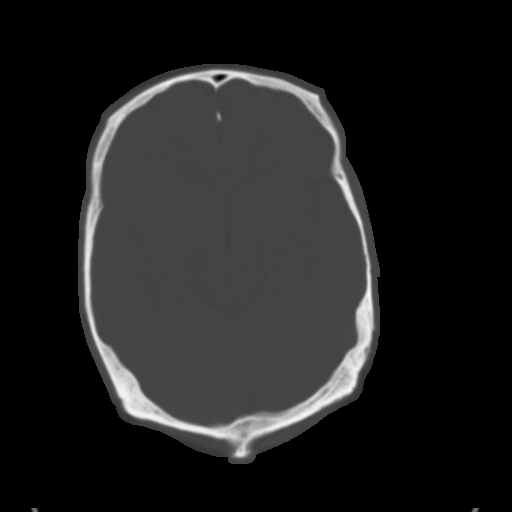
[im 14/36  brain]
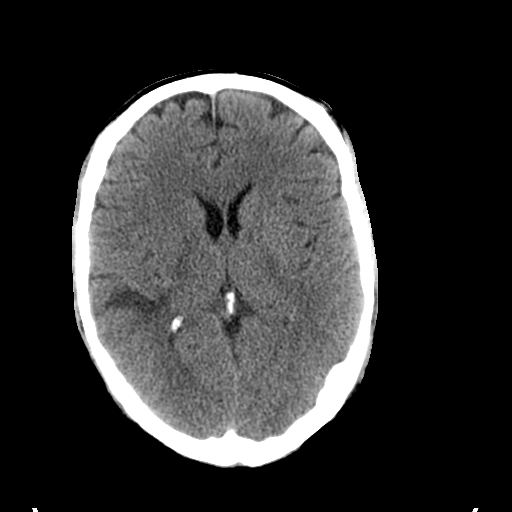
[im 16/36  brain]
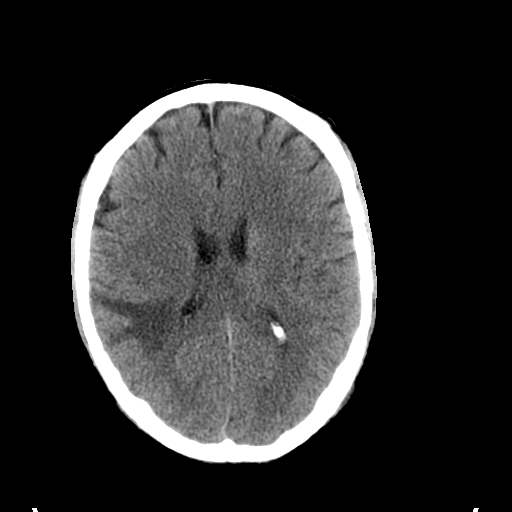
[im 19/36  brain]
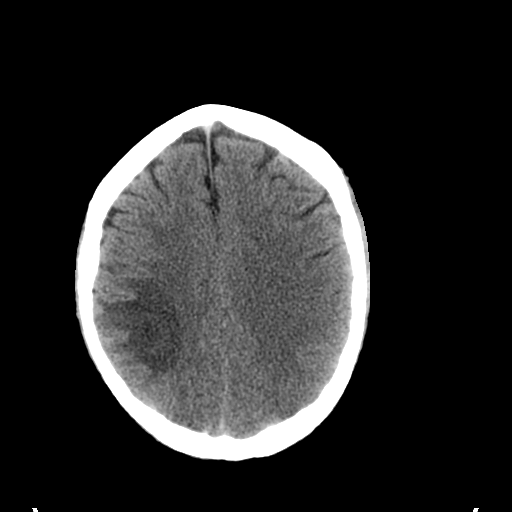
[im 20/36  brain]
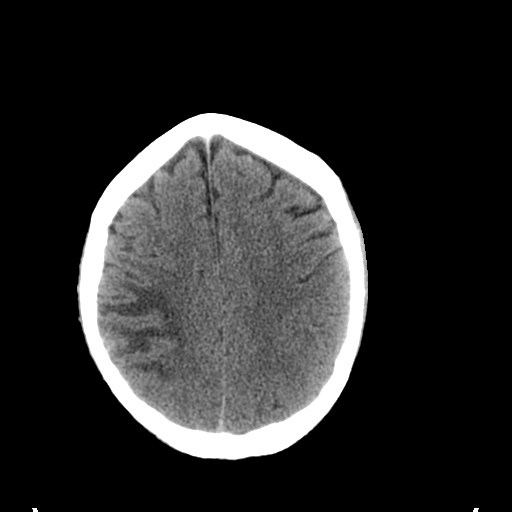
[im 20/36  bone]
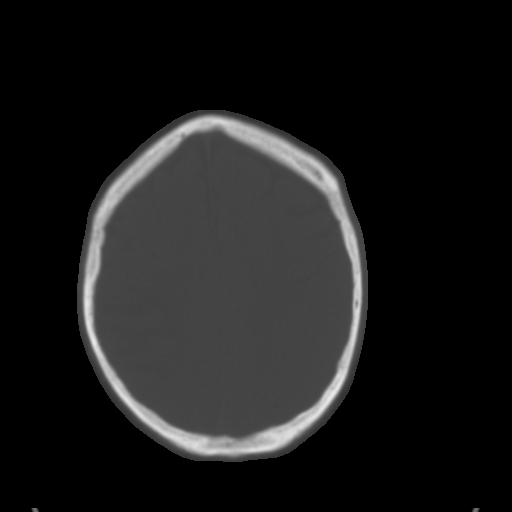
[im 22/36  brain]
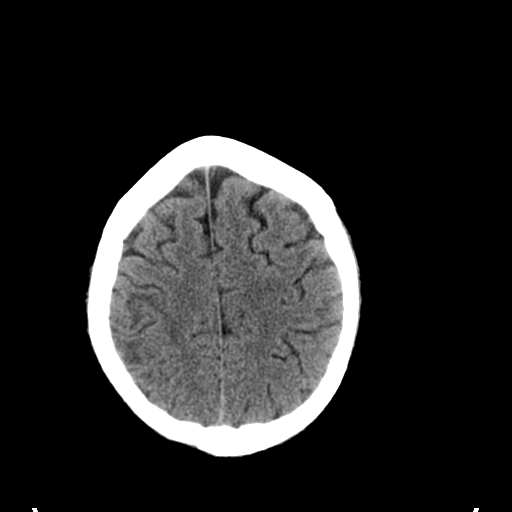
[im 25/36  brain]
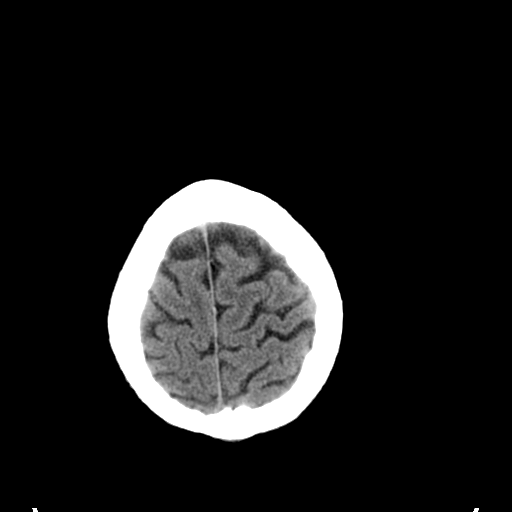
[im 27/36  brain]
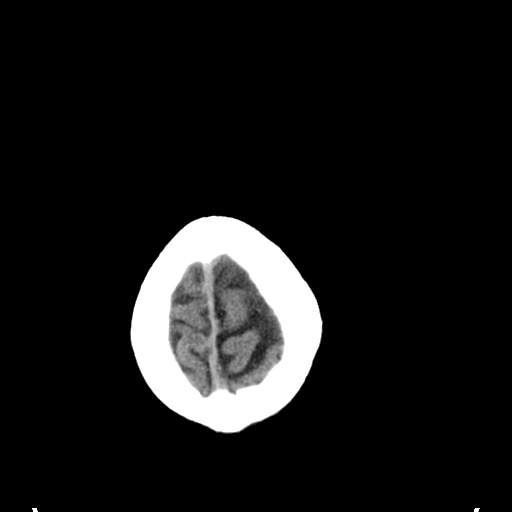
[im 29/36  brain]
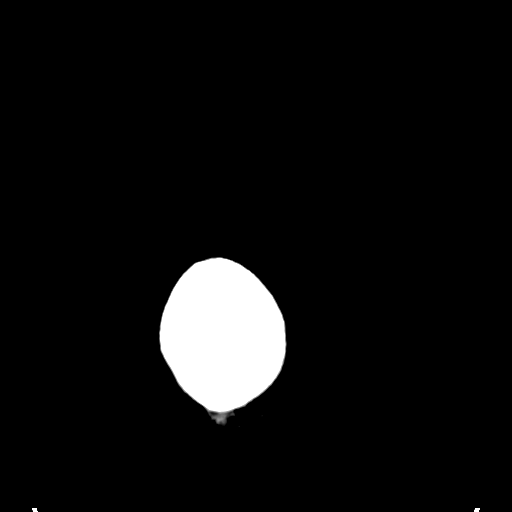
[im 29/36  bone]
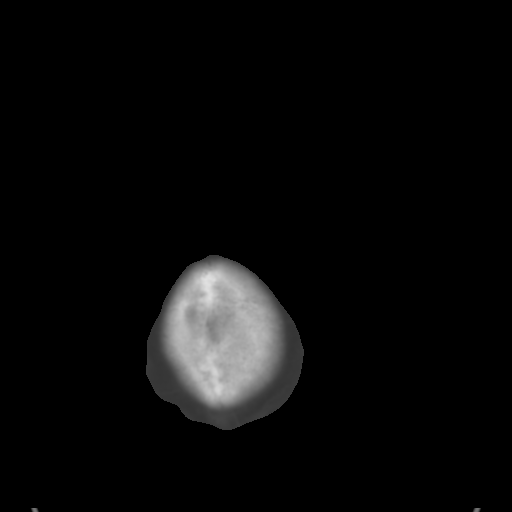
[im 32/36  brain]
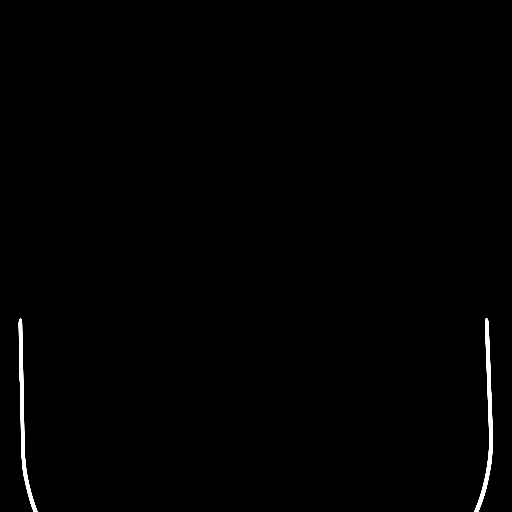
[im 34/36  brain]
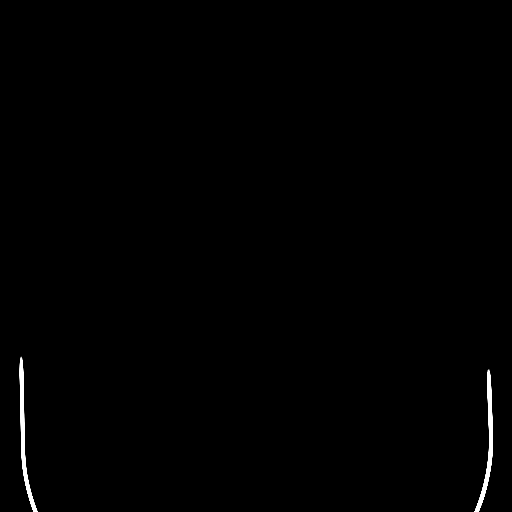

[15 of 30 positions shown; findings below may reference images not displayed]

FINDINGS: There is no evidence of intra- or extra-axial hemorrhage on CT.

Focal decreased white matter attenuation is noted at the right
parietal lobe, with mild loss of gray-white differentiation. Given
the history of HIV, this could reflect progressive multifocal
leukodystrophy, or possibly an underlying mass. A small abscess
could conceivably have a similar appearance, if the patient has
corresponding symptoms.

The posterior fossa, including the cerebellum, brainstem and fourth
ventricle, is within normal limits. The third and lateral
ventricles, and basal ganglia are unremarkable in appearance. No
midline shift is seen.

There is no evidence of fracture; visualized osseous structures are
unremarkable in appearance. The visualized portions of the orbits
are within normal limits. The paranasal sinuses and mastoid air
cells are well-aerated. No significant soft tissue abnormalities are
seen.
IMPRESSION: Focal white matter hypoattenuation at the right parietal lobe, with
mild loss of gray-white differentiation. Given the history of HIV,
this could reflect progressive multifocal leukodystrophy, or
possibly an underlying mass. A small abscess could conceivably have
a similar appearance, if the patient has corresponding symptoms.

MRI of the brain with and without contrast is recommended for
further evaluation; would also correlate with the patient's CD4
count.

These results were called by telephone at the time of interpretation
on 09/06/2014 at [DATE] to Dr. ROSALVA SCHERR, who verbally
acknowledged these results.

## 2015-11-11 IMAGING — RF DG FLUORO GUIDE LUMBAR PUNCTURE
1 series · 1 of 1 positions shown · non-contrast
Comparison: none

CLINICAL DATA: Abnormal brain MRI.

[Series 1: cp_standard · 0.19mm/px · 1 of 1 slices shown]
[im 1/1]
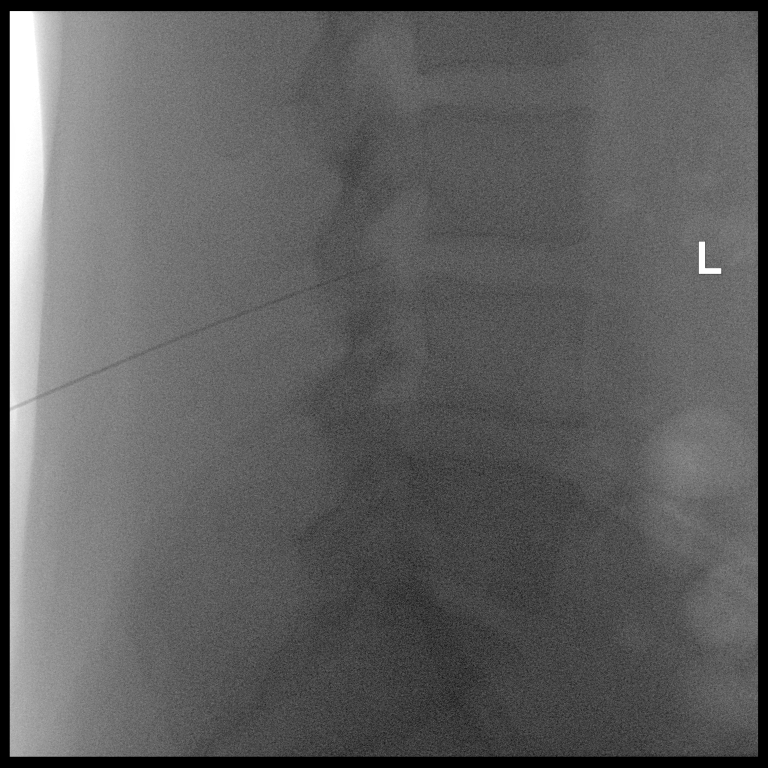

[1 of 1 positions shown; findings below may reference images not displayed]

EXAM:
DIAGNOSTIC LUMBAR PUNCTURE UNDER FLUOROSCOPIC GUIDANCE

FLUOROSCOPY TIME:  Radiation Exposure Index (as provided by the
fluoroscopic device): 9.2 mGy entrance dose

If the device does not provide the exposure index:

Fluoroscopy Time (in minutes and seconds):  0.8 minutes

Number of Acquired Images:  1

PROCEDURE:
Informed consent was obtained from the patient prior to the
procedure, including potential complications of headache, allergy,
and pain. With the patient left lateral decubitus, the lower back
was prepped with Betadine. 1% Lidocaine was used for local
anesthesia. Lumbar puncture was performed at the L3-4 level using a
22 gauge needle with return of clear CSF with an opening pressure of
33 cm water. Large volume tap was requested. Despite the opening
pressure, flow was intermittent due to tenuous needle position, and
approximately 25 ml of CSF were obtained for laboratory studies. The
patient tolerated the procedure well and there were no apparent
complications.
IMPRESSION: 1. Successful lumbar puncture at the L3-4 level.
2. Opening pressure 33 cc water.

## 2015-11-12 IMAGING — CR DG CHEST 1V PORT
1 series · 1 of 1 positions shown · non-contrast
Comparison: None.

CLINICAL DATA: Headache.  No chest complaints.  Brain lesion.  HIV.

EXAM:
PORTABLE CHEST - 1 VIEW

[AP]
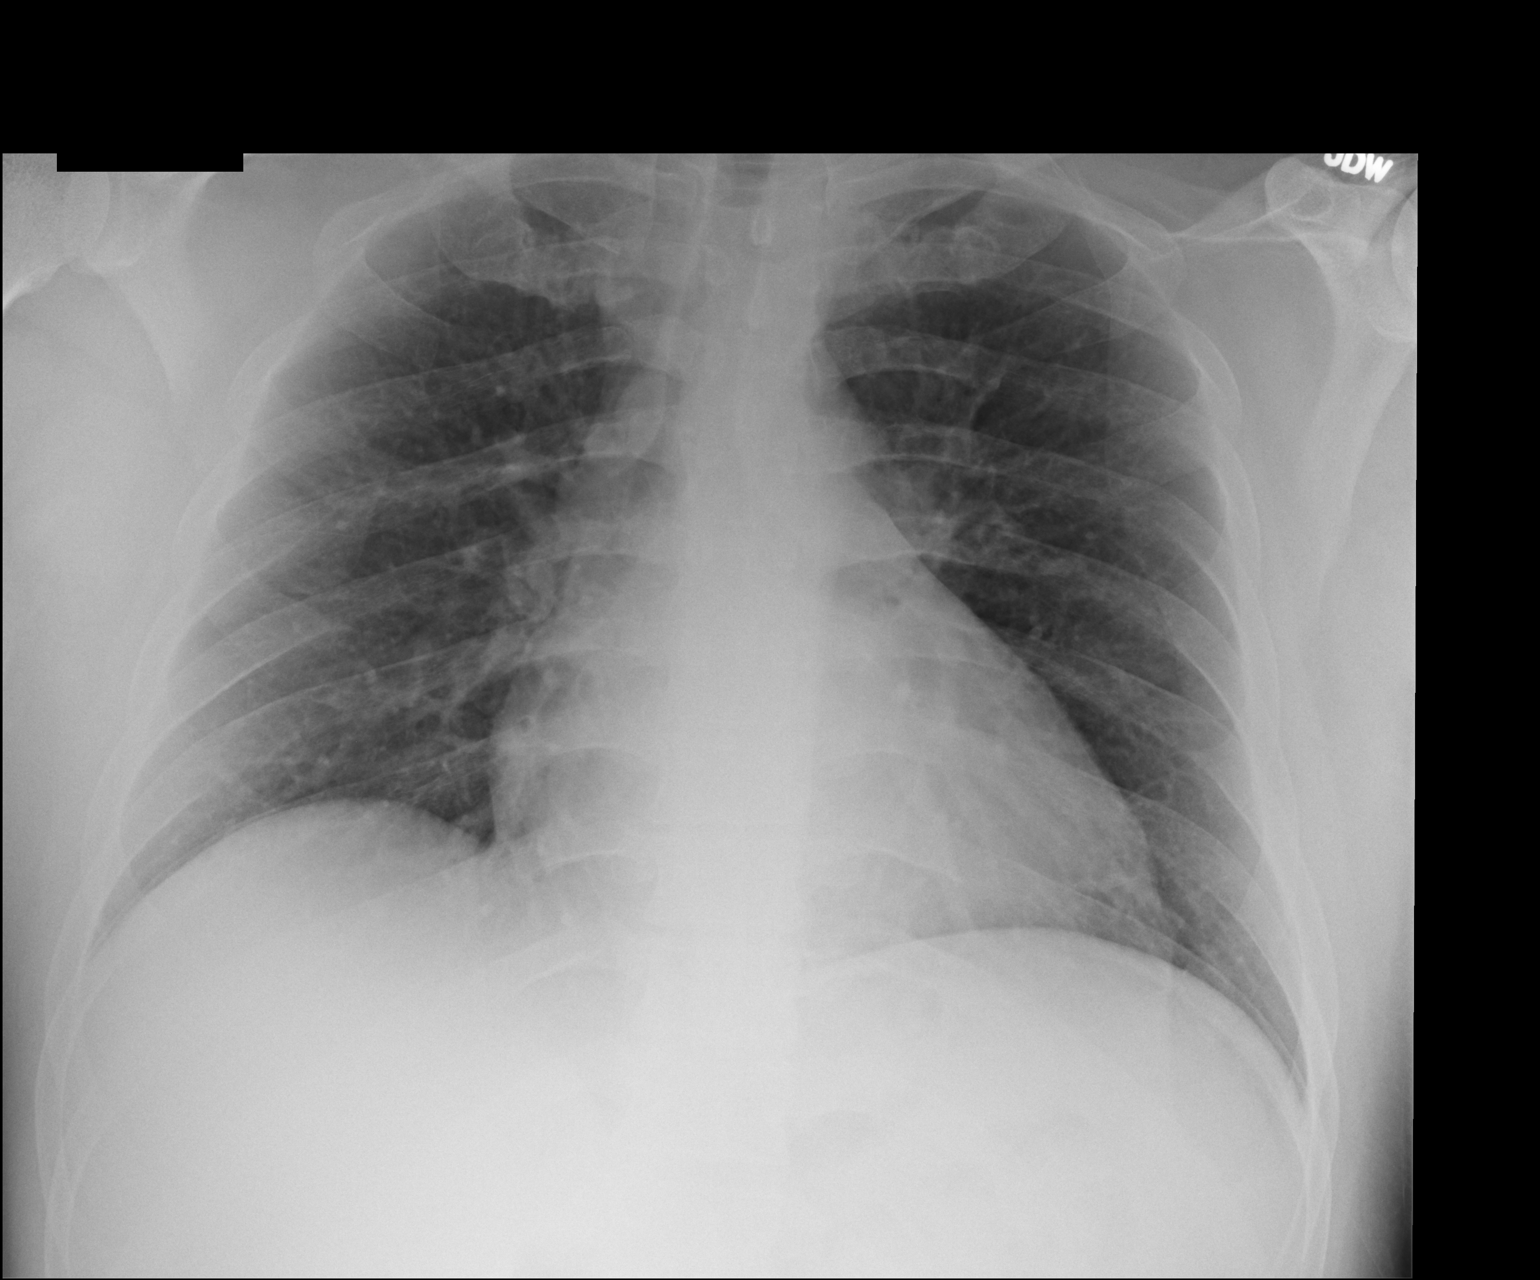

[1 of 1 positions shown; findings below may reference images not displayed]

FINDINGS: The heart size and mediastinal contours are within normal limits.
Both lungs are clear. The visualized skeletal structures are
unremarkable.
IMPRESSION: No active disease.
